# Patient Record
Sex: Male | Born: 1990 | Race: White | Hispanic: No | Marital: Single | Smoking: Current some day smoker
Health system: Southern US, Community
[De-identification: ages and names within clinical notes are randomized; demographics above are authoritative.]

## PROBLEM LIST (undated history)

## (undated) DIAGNOSIS — F609 Personality disorder, unspecified: Secondary | ICD-10-CM

## (undated) DIAGNOSIS — J45909 Unspecified asthma, uncomplicated: Secondary | ICD-10-CM

## (undated) DIAGNOSIS — I10 Essential (primary) hypertension: Secondary | ICD-10-CM

## (undated) DIAGNOSIS — F4310 Post-traumatic stress disorder, unspecified: Secondary | ICD-10-CM

## (undated) DIAGNOSIS — K259 Gastric ulcer, unspecified as acute or chronic, without hemorrhage or perforation: Secondary | ICD-10-CM

## (undated) DIAGNOSIS — F909 Attention-deficit hyperactivity disorder, unspecified type: Secondary | ICD-10-CM

## (undated) HISTORY — PX: FOREARM SURGERY: SHX651

---
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
## 2012-11-22 ENCOUNTER — Encounter (HOSPITAL_COMMUNITY): Payer: Self-pay | Admitting: *Deleted

## 2012-11-22 ENCOUNTER — Emergency Department (HOSPITAL_COMMUNITY)
Admission: EM | Admit: 2012-11-22 | Discharge: 2012-11-22 | Disposition: A | Discharge status: Home / Self Care | Payer: Medicaid Other | Attending: Emergency Medicine | Admitting: Emergency Medicine

## 2012-11-22 DIAGNOSIS — Z8669 Personal history of other diseases of the nervous system and sense organs: Secondary | ICD-10-CM | POA: Insufficient documentation

## 2012-11-22 DIAGNOSIS — Z8719 Personal history of other diseases of the digestive system: Secondary | ICD-10-CM | POA: Insufficient documentation

## 2012-11-22 DIAGNOSIS — E119 Type 2 diabetes mellitus without complications: Secondary | ICD-10-CM | POA: Insufficient documentation

## 2012-11-22 DIAGNOSIS — R109 Unspecified abdominal pain: Secondary | ICD-10-CM | POA: Insufficient documentation

## 2012-11-22 DIAGNOSIS — R197 Diarrhea, unspecified: Secondary | ICD-10-CM | POA: Insufficient documentation

## 2012-11-22 DIAGNOSIS — R112 Nausea with vomiting, unspecified: Secondary | ICD-10-CM | POA: Insufficient documentation

## 2012-11-22 DIAGNOSIS — F17200 Nicotine dependence, unspecified, uncomplicated: Secondary | ICD-10-CM | POA: Insufficient documentation

## 2012-11-22 DIAGNOSIS — R52 Pain, unspecified: Secondary | ICD-10-CM | POA: Insufficient documentation

## 2012-11-22 HISTORY — DX: Post-traumatic stress disorder, unspecified: F43.10

## 2012-11-22 HISTORY — DX: Gastric ulcer, unspecified as acute or chronic, without hemorrhage or perforation: K25.9

## 2012-11-22 MED ORDER — KETOROLAC TROMETHAMINE 30 MG/ML IJ SOLN
30.0000 mg | Freq: Once | INTRAMUSCULAR | Status: AC
  Administered 2012-11-22: 30 mg via INTRAVENOUS
  Filled 2012-11-22: qty 1

## 2012-11-22 MED ORDER — PROMETHAZINE HCL 25 MG PO TABS: 25.0000 mg | ORAL_TABLET | Freq: Four times a day (QID) | ORAL | Status: DC | PRN

## 2012-11-22 MED ORDER — SODIUM CHLORIDE 0.9 % IV BOLUS (SEPSIS)
1000.0000 mL | Freq: Once | INTRAVENOUS | Status: AC
  Administered 2012-11-22: 1000 mL via INTRAVENOUS

## 2012-11-22 MED ORDER — ONDANSETRON HCL 4 MG/2ML IJ SOLN
4.0000 mg | Freq: Once | INTRAMUSCULAR | Status: AC
  Administered 2012-11-22: 4 mg via INTRAVENOUS
  Filled 2012-11-22: qty 2

## 2012-11-22 NOTE — ED Provider Notes (Signed)
History     CSN: [id number]  Arrival date & time 11/22/12  0028   First MD Initiated Contact with Patient 11/22/12 0047      Chief Complaint  Patient presents with  . Abdominal Pain  . Nausea  . Emesis  . Diarrhea  . Generalized Body Aches    (Consider location/radiation/quality/duration/timing/severity/associated sxs/prior treatment) The history is provided by the patient.   nausea vomiting and diarrhea for the last 3 days. No fevers. No blood in emesis or stools. Has had some intermittent abdominal cramping. No pain at this time. No known sick contacts. No rash or shortness of breath. No recent travel. No recent antibiotics. Brought in by EMS and was given Zofran prior to arrival. Still some mild nausea after medication. Moderate in severity.  Past Medical History  Diagnosis Date  . Diabetes mellitus without complication   . PTSD (post-traumatic stress disorder)   . Stomach ulcer     History reviewed. No pertinent past surgical history.  History reviewed. No pertinent family history.  History  Substance Use Topics  . Smoking status: Current Some Day Smoker  . Smokeless tobacco: Never Used  . Alcohol Use: No      Review of Systems  Constitutional: Negative for fever and chills.  HENT: Negative for neck pain and neck stiffness.   Eyes: Negative for pain.  Respiratory: Negative for shortness of breath.   Cardiovascular: Negative for chest pain.  Gastrointestinal: Positive for nausea, vomiting and diarrhea. Negative for abdominal pain and blood in stool.  Genitourinary: Negative for dysuria.  Musculoskeletal: Negative for back pain.  Skin: Negative for rash.  Neurological: Negative for headaches.  All other systems reviewed and are negative.    Allergies  Geodon  Home Medications   Current Outpatient Rx  Name  Route  Sig  Dispense  Refill  . ONDANSETRON HCL 4 MG PO TABS   Oral   Take 4 mg by mouth once.           BP 107/55  Pulse 62  Temp 98.9  F (37.2 C) (Oral)  Resp 21  SpO2 98%  Physical Exam  Constitutional: He is oriented to person, place, and time. He appears well-developed and well-nourished.  HENT:  Head: Normocephalic and atraumatic.  Mouth/Throat: Oropharynx is clear and moist. No oropharyngeal exudate.       Dry mucous membranes  Eyes: EOM are normal. Pupils are equal, round, and reactive to light. No scleral icterus.  Neck: Normal range of motion. Neck supple.  Cardiovascular: Normal rate, regular rhythm and intact distal pulses.   Pulmonary/Chest: Effort normal and breath sounds normal. No respiratory distress. He has no rales. He exhibits no tenderness.  Abdominal: Soft. Bowel sounds are normal. He exhibits no distension. There is no tenderness. There is no rebound and no guarding.  Musculoskeletal: Normal range of motion. He exhibits no edema.  Lymphadenopathy:    He has no cervical adenopathy.  Neurological: He is alert and oriented to person, place, and time.  Skin: Skin is warm and dry.    ED Course  Procedures (including critical care time)  IV fluids. Zofran. Toradol  2:58 AM on recheck is resting comfortably with benign abdominal exam. No emesis or diarrhea in the emergency department. Patient is comfortable to be discharged home. Plan prescription of Phenergan with good return precautions verbalized is understood. MDM   Nausea vomiting and diarrhea likely viral illness. No acute abdomen serial exams.  Provided IV fluids and medications as above. All  signs and nursing notes reviewed and considered.  Condition improved and stable for discharge home        Sunnie Nielsen, MD 11/22/12 0301

## 2012-11-22 NOTE — ED Notes (Signed)
Per EMS: pt coming from a group home. Pt c/o abdominal pain, n/v/d, body aches and cramps. 20g IV in right AC. Pt given 4mg  of zofran at 0015. A&Ox4, skin warm and dry, respirations equal and unlabored. Pt was dry heaving en route.

## 2012-12-24 ENCOUNTER — Encounter (HOSPITAL_COMMUNITY): Payer: Self-pay | Admitting: *Deleted

## 2012-12-24 ENCOUNTER — Emergency Department (HOSPITAL_COMMUNITY)
Admission: EM | Admit: 2012-12-24 | Discharge: 2012-12-24 | Disposition: A | Discharge status: Home / Self Care | Payer: Medicaid Other | Attending: Emergency Medicine | Admitting: Emergency Medicine

## 2012-12-24 DIAGNOSIS — X58XXXA Exposure to other specified factors, initial encounter: Secondary | ICD-10-CM | POA: Insufficient documentation

## 2012-12-24 DIAGNOSIS — Y929 Unspecified place or not applicable: Secondary | ICD-10-CM | POA: Insufficient documentation

## 2012-12-24 DIAGNOSIS — E119 Type 2 diabetes mellitus without complications: Secondary | ICD-10-CM | POA: Insufficient documentation

## 2012-12-24 DIAGNOSIS — L089 Local infection of the skin and subcutaneous tissue, unspecified: Secondary | ICD-10-CM

## 2012-12-24 DIAGNOSIS — S91009A Unspecified open wound, unspecified ankle, initial encounter: Secondary | ICD-10-CM | POA: Insufficient documentation

## 2012-12-24 DIAGNOSIS — S81009A Unspecified open wound, unspecified knee, initial encounter: Secondary | ICD-10-CM | POA: Insufficient documentation

## 2012-12-24 DIAGNOSIS — R509 Fever, unspecified: Secondary | ICD-10-CM | POA: Insufficient documentation

## 2012-12-24 DIAGNOSIS — Z79899 Other long term (current) drug therapy: Secondary | ICD-10-CM | POA: Insufficient documentation

## 2012-12-24 DIAGNOSIS — Z8659 Personal history of other mental and behavioral disorders: Secondary | ICD-10-CM | POA: Insufficient documentation

## 2012-12-24 DIAGNOSIS — Y939 Activity, unspecified: Secondary | ICD-10-CM | POA: Insufficient documentation

## 2012-12-24 DIAGNOSIS — F17200 Nicotine dependence, unspecified, uncomplicated: Secondary | ICD-10-CM | POA: Insufficient documentation

## 2012-12-24 DIAGNOSIS — Z8719 Personal history of other diseases of the digestive system: Secondary | ICD-10-CM | POA: Insufficient documentation

## 2012-12-24 MED ORDER — CEFTRIAXONE SODIUM 1 G IJ SOLR
1.0000 g | Freq: Once | INTRAMUSCULAR | Status: AC
  Administered 2012-12-24: 1 g via INTRAMUSCULAR
  Filled 2012-12-24: qty 10

## 2012-12-24 MED ORDER — SULFAMETHOXAZOLE-TMP DS 800-160 MG PO TABS
1.0000 | ORAL_TABLET | Freq: Once | ORAL | Status: AC
  Administered 2012-12-24: 1 via ORAL
  Filled 2012-12-24: qty 1

## 2012-12-24 MED ORDER — SULFAMETHOXAZOLE-TRIMETHOPRIM 800-160 MG PO TABS: 1.0000 | ORAL_TABLET | Freq: Two times a day (BID) | ORAL | Status: DC

## 2012-12-24 NOTE — ED Notes (Signed)
Patient reports he has a lac to his left knee for two to three days.  He states the area is looking infected,  States he is unsure how this happened

## 2012-12-24 NOTE — ED Provider Notes (Signed)
History     CSN: [id number]  Arrival date & time 12/24/12  1218   First MD Initiated Contact with Patient 12/24/12 1233      Chief Complaint  Patient presents with  . Extremity Laceration    (Consider location/radiation/quality/duration/timing/severity/associated sxs/prior treatment) HPI  Christopher Meadows is a 22 y.o. male complaining of laceration to left knee which she noticed 2 days ago while he was in jail. Patient states that he has no recollection of how the injury occurred. He states his pain is minimal. He reports subjective fever with no nausea or vomiting. He's been cleansing the wound with rubbing alcohol, but there is active drainage.  Past Medical History  Diagnosis Date  . Diabetes mellitus without complication   . PTSD (post-traumatic stress disorder)   . Stomach ulcer     Past Surgical History  Procedure Date  . Forearm surgery     No family history on file.  History  Substance Use Topics  . Smoking status: Current Some Day Smoker  . Smokeless tobacco: Never Used  . Alcohol Use: No      Review of Systems  Constitutional: Positive for fever.  Respiratory: Negative for shortness of breath.   Cardiovascular: Negative for chest pain.  Gastrointestinal: Negative for nausea, vomiting, abdominal pain and diarrhea.  Skin: Positive for wound.  All other systems reviewed and are negative.    Allergies  Geodon  Home Medications   Current Outpatient Rx  Name  Route  Sig  Dispense  Refill  . BUSPIRONE HCL 10 MG PO TABS   Oral   Take 10 mg by mouth 3 (three) times daily.         Marland Kitchen CLONAZEPAM 0.5 MG PO TABS   Oral   Take 0.5 mg by mouth daily.         Marland Kitchen CLOZAPINE 200 MG PO TABS   Oral   Take 200 mg by mouth daily.         Marland Kitchen OXCARBAZEPINE 600 MG PO TABS   Oral   Take 600 mg by mouth 2 (two) times daily.         . SERTRALINE HCL 50 MG PO TABS   Oral   Take 50 mg by mouth daily.           BP 126/61  Pulse 83  Temp 98.5 F (36.9  C) (Oral)  Resp 20  Ht 5\' 10"  (1.778 m)  Wt 268 lb (121.564 kg)  BMI 38.45 kg/m2  SpO2 98%  Physical Exam  Nursing note and vitals reviewed. Constitutional: He is oriented to person, place, and time. He appears well-developed and well-nourished. No distress.  HENT:  Head: Normocephalic.  Eyes: Conjunctivae normal and EOM are normal.  Cardiovascular: Normal rate.   Pulmonary/Chest: Effort normal. No stridor.  Musculoskeletal: Normal range of motion.  Neurological: He is alert and oriented to person, place, and time.  Skin:          4 cm full-thickness laceration with no intrusion into the joint space or bony involvement. Actively draining purulent material. 1 cm of surrounding induration.  Psychiatric: He has a normal mood and affect.    ED Course  Procedures (including critical care time)  Wound irrigated under low pressure with 1.5 L normal saline explored to depth and good light on a bloodless field  Labs Reviewed - No data to display No results found.   1. Infected wound       MDM  Wound profusely irrigated,  patient given a gram of Rocephin IM and started on Bactrim. I instructed him to return in 48 hours for a wound check. Explicit return precautions given.   Filed Vitals:   12/24/12 1221 12/24/12 1226  BP: 126/61   Pulse: 83   Temp: 98.5 F (36.9 C)   TempSrc: Oral   Resp: 20   Height:  5\' 10"  (1.778 m)  Weight:  268 lb (121.564 kg)  SpO2: 98%      Pt verbalized understanding and agrees with care plan. Outpatient follow-up and return precautions given.    New Prescriptions   SULFAMETHOXAZOLE-TRIMETHOPRIM (SEPTRA DS) 800-160 MG PER TABLET    Take 1 tablet by mouth every 12 (twelve) hours.            Wynetta Emery, PA-C 12/24/12 2203

## 2012-12-25 NOTE — ED Provider Notes (Signed)
Medical screening examination/treatment/procedure(s) were performed by non-physician practitioner and as supervising physician I was immediately available for consultation/collaboration.   Eulas Schweitzer E Marcelo Ickes, MD 12/25/12 1449 

## 2013-01-17 ENCOUNTER — Encounter (HOSPITAL_COMMUNITY): Payer: Self-pay | Admitting: Emergency Medicine

## 2013-01-17 ENCOUNTER — Emergency Department (INDEPENDENT_AMBULATORY_CARE_PROVIDER_SITE_OTHER)
Admission: EM | Admit: 2013-01-17 | Discharge: 2013-01-17 | Disposition: A | Discharge status: Home / Self Care | Payer: Medicaid Other | Source: Home / Self Care | Attending: Family Medicine | Admitting: Family Medicine

## 2013-01-17 DIAGNOSIS — I10 Essential (primary) hypertension: Secondary | ICD-10-CM

## 2013-01-17 HISTORY — DX: Personality disorder, unspecified: F60.9

## 2013-01-17 MED ORDER — CARVEDILOL 6.25 MG PO TABS: 6.2500 mg | ORAL_TABLET | Freq: Two times a day (BID) | ORAL | Status: DC

## 2013-01-17 MED ORDER — METFORMIN HCL 500 MG PO TABS: 500.0000 mg | ORAL_TABLET | Freq: Two times a day (BID) | ORAL | Status: DC

## 2013-01-17 NOTE — ED Provider Notes (Signed)
History     CSN: [id number]  Arrival date & time 01/17/13  1152   First MD Initiated Contact with Patient 01/17/13 1220      Chief Complaint  Patient presents with  . Medication Refill    (Consider location/radiation/quality/duration/timing/severity/associated sxs/prior treatment) Patient is a 22 y.o. male presenting with hypertension. The history is provided by the patient and a caregiver.  Hypertension This is a chronic problem. Episode onset: here for medication refills prior to reestablishing with lmd.    Past Medical History  Diagnosis Date  . Diabetes mellitus without complication   . PTSD (post-traumatic stress disorder)   . Stomach ulcer   . Personality disorder     Past Surgical History  Procedure Laterality Date  . Forearm surgery      No family history on file.  History  Substance Use Topics  . Smoking status: Current Some Day Smoker  . Smokeless tobacco: Never Used  . Alcohol Use: No      Review of Systems  Constitutional: Negative.     Allergies  Geodon  Home Medications   Current Outpatient Rx  Name  Route  Sig  Dispense  Refill  . busPIRone (BUSPAR) 10 MG tablet   Oral   Take 10 mg by mouth 3 (three) times daily.         . clonazePAM (KLONOPIN) 0.5 MG tablet   Oral   Take 0.5 mg by mouth daily.         . clozapine (CLOZARIL) 200 MG tablet   Oral   Take 200 mg by mouth daily.         . hydrOXYzine (ATARAX/VISTARIL) 25 MG tablet   Oral   Take 25 mg by mouth 3 (three) times daily as needed for itching.         Marland Kitchen oxcarbazepine (TRILEPTAL) 600 MG tablet   Oral   Take 600 mg by mouth 2 (two) times daily.         . sertraline (ZOLOFT) 50 MG tablet   Oral   Take 50 mg by mouth daily.         . carvedilol (COREG) 6.25 MG tablet   Oral   Take 1 tablet (6.25 mg total) by mouth 2 (two) times daily with a meal.   60 tablet   1   . metFORMIN (GLUCOPHAGE) 500 MG tablet   Oral   Take 1 tablet (500 mg total) by mouth 2  (two) times daily with a meal.   60 tablet   1   . sulfamethoxazole-trimethoprim (SEPTRA DS) 800-160 MG per tablet   Oral   Take 1 tablet by mouth every 12 (twelve) hours.   14 tablet   0     BP 128/72  Pulse 63  Temp(Src) 97.9 F (36.6 C) (Oral)  Resp 20  SpO2 98%  Physical Exam  Nursing note and vitals reviewed. Constitutional: He is oriented to person, place, and time. He appears well-developed and well-nourished.  Neurological: He is alert and oriented to person, place, and time.  Skin: Skin is warm and dry.    ED Course  Procedures (including critical care time)  Labs Reviewed - No data to display No results found.   1. Hypertension       MDM          Linna Hoff, MD 01/17/13 [phone]

## 2013-01-17 NOTE — ED Notes (Addendum)
Patient here today for refill of certain medicines.  Patient is in a group home locally, arrived December 2013.  Patient reports trouble sleeping, sob during the night.   Also requesting refills of the following:   aspirin ec 81mg  qd Carvedilol 3.125mg  bid,  Metformin 500mg  bid,  Carvedilol 6.25mg  bid

## 2013-01-24 ENCOUNTER — Emergency Department: Payer: Self-pay | Admitting: Emergency Medicine

## 2013-01-24 LAB — CBC
HCT: 45.2 % (ref 40.0–52.0)
HGB: 15.1 g/dL (ref 13.0–18.0)
MCHC: 33.4 g/dL (ref 32.0–36.0)
MCV: 84 fL (ref 80–100)
Platelet: 264 10*3/uL (ref 150–440)
RBC: 5.38 10*6/uL (ref 4.40–5.90)
RDW: 13.7 % (ref 11.5–14.5)
WBC: 10.2 10*3/uL (ref 3.8–10.6)

## 2013-01-24 LAB — COMPREHENSIVE METABOLIC PANEL
Albumin: 4.2 g/dL (ref 3.8–5.6)
BUN: 15 mg/dL (ref 7–18)
Bilirubin,Total: 0.2 mg/dL (ref 0.2–1.0)
Calcium, Total: 8.7 mg/dL — ABNORMAL LOW (ref 9.0–10.7)
Chloride: 108 mmol/L — ABNORMAL HIGH (ref 98–107)
Co2: 25 mmol/L (ref 21–32)
Creatinine: 0.87 mg/dL (ref 0.60–1.30)
EGFR (African American): >60
Osmolality: 278 (ref 275–301)
Potassium: 4.1 mmol/L (ref 3.5–5.1)

## 2013-01-31 ENCOUNTER — Emergency Department: Payer: Self-pay | Admitting: Emergency Medicine

## 2013-01-31 LAB — URINALYSIS, COMPLETE
Bacteria: NONE SEEN
Glucose,UR: NEGATIVE mg/dL (ref 0–75)
Ph: 6 (ref 4.5–8.0)
RBC,UR: <1 /HPF (ref 0–5)
Specific Gravity: 1.024 (ref 1.003–1.030)

## 2013-01-31 LAB — COMPREHENSIVE METABOLIC PANEL
Alkaline Phosphatase: 155 U/L (ref 98–317)
Anion Gap: 7 (ref 7–16)
BUN: 11 mg/dL (ref 7–18)
Calcium, Total: 9 mg/dL (ref 9.0–10.7)
Co2: 26 mmol/L (ref 21–32)
Creatinine: 0.7 mg/dL (ref 0.60–1.30)
EGFR (African American): >60
Glucose: 111 mg/dL — ABNORMAL HIGH (ref 65–99)
SGOT(AST): 24 U/L (ref 10–41)
SGPT (ALT): 54 U/L (ref 12–78)
Sodium: 140 mmol/L (ref 136–145)

## 2013-01-31 LAB — DRUG SCREEN, URINE
Amphetamines, Ur Screen: NEGATIVE (ref ?–1000)
Barbiturates, Ur Screen: NEGATIVE (ref ?–200)
Cocaine Metabolite,Ur ~~LOC~~: NEGATIVE (ref ?–300)
MDMA (Ecstasy)Ur Screen: NEGATIVE (ref ?–500)
Phencyclidine (PCP) Ur S: NEGATIVE (ref ?–25)

## 2013-01-31 LAB — CBC
HGB: 16.1 g/dL (ref 13.0–18.0)
MCH: 29.4 pg (ref 26.0–34.0)
RDW: 13.3 % (ref 11.5–14.5)
WBC: 9.5 10*3/uL (ref 3.8–10.6)

## 2013-01-31 LAB — ETHANOL
Ethanol %: <0.003 % (ref 0.000–0.080)
Ethanol: <3 mg/dL

## 2013-01-31 LAB — TSH: Thyroid Stimulating Horm: 1.01 u[IU]/mL

## 2013-02-03 ENCOUNTER — Encounter (HOSPITAL_COMMUNITY): Payer: Self-pay | Admitting: *Deleted

## 2013-02-03 ENCOUNTER — Emergency Department (HOSPITAL_COMMUNITY)
Admission: EM | Admit: 2013-02-03 | Discharge: 2013-02-04 | Disposition: A | Discharge status: Home / Self Care | Payer: Medicaid Other | Attending: Emergency Medicine | Admitting: Emergency Medicine

## 2013-02-03 DIAGNOSIS — R51 Headache: Secondary | ICD-10-CM | POA: Insufficient documentation

## 2013-02-03 DIAGNOSIS — E119 Type 2 diabetes mellitus without complications: Secondary | ICD-10-CM | POA: Insufficient documentation

## 2013-02-03 DIAGNOSIS — F17200 Nicotine dependence, unspecified, uncomplicated: Secondary | ICD-10-CM | POA: Insufficient documentation

## 2013-02-03 DIAGNOSIS — F609 Personality disorder, unspecified: Secondary | ICD-10-CM | POA: Insufficient documentation

## 2013-02-03 DIAGNOSIS — Z8659 Personal history of other mental and behavioral disorders: Secondary | ICD-10-CM | POA: Insufficient documentation

## 2013-02-03 DIAGNOSIS — Z8719 Personal history of other diseases of the digestive system: Secondary | ICD-10-CM | POA: Insufficient documentation

## 2013-02-03 DIAGNOSIS — Z79899 Other long term (current) drug therapy: Secondary | ICD-10-CM | POA: Insufficient documentation

## 2013-02-03 MED ORDER — METOCLOPRAMIDE HCL 5 MG/ML IJ SOLN
10.0000 mg | Freq: Once | INTRAMUSCULAR | Status: AC
  Administered 2013-02-03: 10 mg via INTRAMUSCULAR
  Filled 2013-02-03: qty 2

## 2013-02-03 MED ORDER — DIPHENHYDRAMINE HCL 50 MG/ML IJ SOLN
25.0000 mg | Freq: Once | INTRAMUSCULAR | Status: AC
  Administered 2013-02-03: 25 mg via INTRAMUSCULAR
  Filled 2013-02-03: qty 1

## 2013-02-03 MED ORDER — KETOROLAC TROMETHAMINE 60 MG/2ML IM SOLN
60.0000 mg | Freq: Once | INTRAMUSCULAR | Status: AC
  Administered 2013-02-03: 60 mg via INTRAMUSCULAR
  Filled 2013-02-03: qty 2

## 2013-02-03 NOTE — ED Notes (Signed)
Pt was brought in via [facility] for headache,  He lives in group home when he is discharged Christopher Meadows at [phone] is to be called for pick up.  Pt walked in from EMS and immediately went out of door to smoke,  Pt now back in for triage,  Pt states ha 10/10

## 2013-02-03 NOTE — ED Provider Notes (Signed)
  Medical screening examination/treatment/procedure(s) were performed by non-physician practitioner and as supervising physician I was immediately available for consultation/collaboration.    Percival Glasheen D Kimberl Vig, MD 02/03/13 2353 

## 2013-02-03 NOTE — ED Notes (Signed)
Pt called back to room, no response. 

## 2013-02-03 NOTE — ED Notes (Signed)
Attempted to call Brett Canales, no answer will return call in 5 minutes

## 2013-02-03 NOTE — ED Notes (Signed)
Spoke with pharmacy stated that Toradol and Benadryl cannot be given together, otherwise they can be mixed.

## 2013-02-03 NOTE — ED Notes (Signed)
Called Brett Canales again, left message for Brett Canales to return call.  Pt will be discharged at this time

## 2013-02-03 NOTE — ED Provider Notes (Signed)
History    This chart was scribed for non-physician practitioner working with Vida Roller, MD by Toya Smothers, ED Scribe. This patient was seen in room WTR5/WTR5 and the patient's care was started at 23:00. CSN: [id number]  Arrival date & time 02/03/13  2059   First MD Initiated Contact with Patient 02/03/13 2226      Chief Complaint  Patient presents with  . Headache    Patient is a 22 y.o. male presenting with headaches. The history is provided by the patient. No language interpreter was used.  Headache Associated symptoms: no abdominal pain, no back pain, no cough, no diarrhea, no fatigue, no fever, no nausea, no neck stiffness and no vomiting     Kaydin Karbowski is a 22 y.o. male who presents to the Emergency Department complaining of HA. Pt is a poor historian and is somewhat uncooperative with hx taking. Pain is 8/10 and unlike any before; but he does not remember if he has had headaches in the past. Symptoms have not been treated PTA. No fever, chills, cough, congestion, rhinorrhea, chest pain, SOB, or n/v/d. Pt denies illicit drug use and alcohol consumption. Nothing makes it better and nothing makes it worse.  Pt sleeping on initiation of exam and remains sleepy throughout.  Pt has been out smoking several times after being brought here.     Past Medical History  Diagnosis Date  . Diabetes mellitus without complication   . PTSD (post-traumatic stress disorder)   . Stomach ulcer   . Personality disorder     Past Surgical History  Procedure Laterality Date  . Forearm surgery      History reviewed. No pertinent family history.  History  Substance Use Topics  . Smoking status: Current Some Day Smoker  . Smokeless tobacco: Never Used  . Alcohol Use: No      Review of Systems  Constitutional: Negative for fever, diaphoresis, appetite change, fatigue and unexpected weight change.  HENT: Negative for mouth sores and neck stiffness.   Eyes: Negative for visual  disturbance.  Respiratory: Negative for cough, chest tightness, shortness of breath and wheezing.   Cardiovascular: Negative for chest pain.  Gastrointestinal: Negative for nausea, vomiting, abdominal pain, diarrhea and constipation.  Endocrine: Negative for polydipsia, polyphagia and polyuria.  Genitourinary: Negative for dysuria, urgency, frequency and hematuria.  Musculoskeletal: Negative for back pain.  Skin: Negative for rash.  Allergic/Immunologic: Negative for immunocompromised state.  Neurological: Positive for headaches. Negative for syncope and light-headedness.  Hematological: Does not bruise/bleed easily.  Psychiatric/Behavioral: Negative for sleep disturbance. The patient is not nervous/anxious.     Allergies  Geodon and Lithium  Home Medications   Current Outpatient Rx  Name  Route  Sig  Dispense  Refill  . busPIRone (BUSPAR) 10 MG tablet   Oral   Take 10 mg by mouth 3 (three) times daily.         . carvedilol (COREG) 6.25 MG tablet   Oral   Take 1 tablet (6.25 mg total) by mouth 2 (two) times daily with a meal.   60 tablet   1   . clonazePAM (KLONOPIN) 0.5 MG tablet   Oral   Take 0.5 mg by mouth daily.         . clozapine (CLOZARIL) 200 MG tablet   Oral   Take 200 mg by mouth daily.         . hydrOXYzine (ATARAX/VISTARIL) 25 MG tablet   Oral   Take 25 mg by mouth  3 (three) times daily as needed for itching.         . metFORMIN (GLUCOPHAGE) 500 MG tablet   Oral   Take 1 tablet (500 mg total) by mouth 2 (two) times daily with a meal.   60 tablet   1   . oxcarbazepine (TRILEPTAL) 600 MG tablet   Oral   Take 600 mg by mouth 2 (two) times daily.         . sertraline (ZOLOFT) 50 MG tablet   Oral   Take 50 mg by mouth daily.           BP 117/72  Pulse 67  Temp(Src) 98.7 F (37.1 C) (Oral)  Resp 16  SpO2 97%  Physical Exam  Nursing note and vitals reviewed. Constitutional: He is oriented to person, place, and time. He appears  well-developed and well-nourished. No distress.  HENT:  Head: Normocephalic and atraumatic. Not macrocephalic.  Right Ear: Tympanic membrane, external ear and ear canal normal.  Left Ear: Tympanic membrane, external ear and ear canal normal.  Nose: Nose normal. No mucosal edema or rhinorrhea.  Mouth/Throat: Uvula is midline, oropharynx is clear and moist and mucous membranes are normal. Mucous membranes are not dry and not cyanotic. No edematous. No oropharyngeal exudate, posterior oropharyngeal edema, posterior oropharyngeal erythema or tonsillar abscesses.  Eyes: Conjunctivae are normal. Pupils are equal, round, and reactive to light. No scleral icterus.  Pupils dilated but reactive  Neck: Normal range of motion. Neck supple. No thyromegaly present.  Cardiovascular: Normal rate, regular rhythm, normal heart sounds and intact distal pulses.  Exam reveals no gallop and no friction rub.   No murmur heard. Pulmonary/Chest: Effort normal and breath sounds normal. No respiratory distress. He has no wheezes. He has no rales. He exhibits no tenderness.  Abdominal: Soft. Bowel sounds are normal. He exhibits no distension and no mass. There is no tenderness. There is no rebound and no guarding.  Musculoskeletal: Normal range of motion. He exhibits no edema.  Lymphadenopathy:    He has no cervical adenopathy.  Neurological: He is alert and oriented to person, place, and time. He has normal reflexes. No cranial nerve deficit. He exhibits normal muscle tone. Coordination normal. GCS eye subscore is 4. GCS verbal subscore is 5. GCS motor subscore is 6.  Reflex Scores:      Tricep reflexes are 2+ on the right side and 2+ on the left side.      Bicep reflexes are 2+ on the right side and 2+ on the left side.      Brachioradialis reflexes are 2+ on the right side and 2+ on the left side.      Patellar reflexes are 2+ on the right side and 2+ on the left side.      Achilles reflexes are 2+ on the right side  and 2+ on the left side. Speech is clear and goal oriented, follows commands Major Cranial nerves without deficit, no facial droop Normal strength in upper and lower extremities bilaterally including dorsiflexion and plantar flexion, strong and equal grip strength Sensation normal to light and sharp touch Moves extremities without ataxia, coordination intact Normal finger to nose and rapid alternating movements Neg romberg, no pronator drift Normal gait and balance  Skin: Skin is warm and dry. He is not diaphoretic. No erythema.  Psychiatric: He has a normal mood and affect.    ED Course  Procedures DIAGNOSTIC STUDIES: Oxygen Saturation is 97% on room air, normal by my interpretation.  COORDINATION OF CARE: 23:00- Evaluated Pt. Pt is lathargic and without distress. 23:04- Patient understand and agree with initial ED impression and plan with expectations set for ED visit.    Labs Reviewed - No data to display No results found.   1. Headache       MDM  Karrie Meres presents with headache.  Pt HA treated and improved while in ED.  Presentation is like pts typical HA and non concerning for [facility], [facility], Meningitis, or temporal arteritis. Pt is afebrile with no focal neuro deficits, nuchal rigidity, or change in vision. Pt is to follow up with PCP to discuss prophylactic medication. Pt verbalizes understanding and is agreeable with plan to dc.     I personally performed the services described in this documentation, which was scribed in my presence. The recorded information has been reviewed and is accurate.   Dahlia Client Muthersbaugh, PA-C 02/03/13 [phone]

## 2013-02-27 ENCOUNTER — Emergency Department (INDEPENDENT_AMBULATORY_CARE_PROVIDER_SITE_OTHER)
Admission: EM | Admit: 2013-02-27 | Discharge: 2013-02-27 | Disposition: A | Discharge status: Home / Self Care | Payer: Medicaid Other | Source: Home / Self Care | Attending: Emergency Medicine | Admitting: Emergency Medicine

## 2013-02-27 ENCOUNTER — Encounter (HOSPITAL_COMMUNITY): Payer: Self-pay | Admitting: Emergency Medicine

## 2013-02-27 DIAGNOSIS — T300 Burn of unspecified body region, unspecified degree: Secondary | ICD-10-CM

## 2013-02-27 DIAGNOSIS — R062 Wheezing: Secondary | ICD-10-CM

## 2013-02-27 MED ORDER — "TELFA CLEAR WOUND 3""X3"" PADS": MEDICATED_PAD | Status: DC

## 2013-02-27 MED ORDER — SILVER SULFADIAZINE 1 % EX CREA: TOPICAL_CREAM | Freq: Every day | CUTANEOUS | Status: DC

## 2013-02-27 MED ORDER — ALBUTEROL SULFATE HFA 108 (90 BASE) MCG/ACT IN AERS: 1.0000 | INHALATION_SPRAY | Freq: Four times a day (QID) | RESPIRATORY_TRACT | Status: AC | PRN

## 2013-02-27 MED ORDER — SILVER SULFADIAZINE 1 % EX CREA
TOPICAL_CREAM | Freq: Once | CUTANEOUS | Status: AC
  Administered 2013-02-27: 13:00:00 via TOPICAL
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
## 2013-03-09 ENCOUNTER — Encounter (HOSPITAL_COMMUNITY): Payer: Self-pay | Admitting: *Deleted

## 2013-03-09 ENCOUNTER — Emergency Department (HOSPITAL_COMMUNITY)
Admission: EM | Admit: 2013-03-09 | Discharge: 2013-03-10 | Disposition: A | Discharge status: Other Acute Inpatient Hospital | Payer: Medicaid Other | Attending: Emergency Medicine | Admitting: Emergency Medicine

## 2013-03-09 DIAGNOSIS — Z8711 Personal history of peptic ulcer disease: Secondary | ICD-10-CM | POA: Insufficient documentation

## 2013-03-09 DIAGNOSIS — F609 Personality disorder, unspecified: Secondary | ICD-10-CM | POA: Insufficient documentation

## 2013-03-09 DIAGNOSIS — Z789 Other specified health status: Secondary | ICD-10-CM | POA: Insufficient documentation

## 2013-03-09 DIAGNOSIS — E119 Type 2 diabetes mellitus without complications: Secondary | ICD-10-CM | POA: Insufficient documentation

## 2013-03-09 DIAGNOSIS — R45851 Suicidal ideations: Secondary | ICD-10-CM

## 2013-03-09 DIAGNOSIS — T300 Burn of unspecified body region, unspecified degree: Secondary | ICD-10-CM

## 2013-03-09 DIAGNOSIS — I10 Essential (primary) hypertension: Secondary | ICD-10-CM | POA: Insufficient documentation

## 2013-03-09 DIAGNOSIS — Z79899 Other long term (current) drug therapy: Secondary | ICD-10-CM | POA: Insufficient documentation

## 2013-03-09 DIAGNOSIS — R21 Rash and other nonspecific skin eruption: Secondary | ICD-10-CM | POA: Insufficient documentation

## 2013-03-09 DIAGNOSIS — T22059A Burn of unspecified degree of unspecified shoulder, initial encounter: Secondary | ICD-10-CM | POA: Insufficient documentation

## 2013-03-09 DIAGNOSIS — X76XXXA Intentional self-harm by smoke, fire and flames, initial encounter: Secondary | ICD-10-CM | POA: Insufficient documentation

## 2013-03-09 DIAGNOSIS — F17200 Nicotine dependence, unspecified, uncomplicated: Secondary | ICD-10-CM | POA: Insufficient documentation

## 2013-03-09 DIAGNOSIS — Z593 Problems related to living in residential institution: Secondary | ICD-10-CM | POA: Insufficient documentation

## 2013-03-09 DIAGNOSIS — F4310 Post-traumatic stress disorder, unspecified: Secondary | ICD-10-CM | POA: Insufficient documentation

## 2013-03-09 HISTORY — DX: Unspecified asthma, uncomplicated: J45.909

## 2013-03-09 HISTORY — DX: Essential (primary) hypertension: I10

## 2013-03-09 HISTORY — DX: Attention-deficit hyperactivity disorder, unspecified type: F90.9

## 2013-03-09 LAB — COMPREHENSIVE METABOLIC PANEL
AST: 66 U/L — ABNORMAL HIGH (ref 0–37)
Albumin: 3.7 g/dL (ref 3.5–5.2)
Alkaline Phosphatase: 129 U/L — ABNORMAL HIGH (ref 39–117)
Chloride: 107 mEq/L (ref 96–112)
Potassium: 3.6 mEq/L (ref 3.5–5.1)
Sodium: 142 mEq/L (ref 135–145)
Total Bilirubin: 0.2 mg/dL — ABNORMAL LOW (ref 0.3–1.2)
Total Protein: 6 g/dL (ref 6.0–8.3)

## 2013-03-09 LAB — CBC WITH DIFFERENTIAL/PLATELET
Basophils Absolute: 0 10*3/uL (ref 0.0–0.1)
Eosinophils Absolute: 0.8 10*3/uL — ABNORMAL HIGH (ref 0.0–0.7)
Hemoglobin: 13.4 g/dL (ref 13.0–17.0)
Lymphocytes Relative: 26 % (ref 12–46)
MCH: 28.5 pg (ref 26.0–34.0)
MCHC: 36.5 g/dL — ABNORMAL HIGH (ref 30.0–36.0)
Monocytes Absolute: 1 10*3/uL (ref 0.1–1.0)
Neutrophils Relative %: 60 % (ref 43–77)
Platelets: 258 10*3/uL (ref 150–400)
RDW: 13.5 % (ref 11.5–15.5)

## 2013-03-09 LAB — RAPID URINE DRUG SCREEN, HOSP PERFORMED
Amphetamines: NOT DETECTED
Barbiturates: NOT DETECTED
Opiates: NOT DETECTED
Tetrahydrocannabinol: NOT DETECTED

## 2013-03-09 MED ORDER — PRAZOSIN HCL 1 MG PO CAPS
1.0000 mg | ORAL_CAPSULE | Freq: Every day | ORAL | Status: DC
  Administered 2013-03-09: 1 mg via ORAL
  Filled 2013-03-09 (×2): qty 1

## 2013-03-09 MED ORDER — SERTRALINE HCL 50 MG PO TABS
50.0000 mg | ORAL_TABLET | Freq: Every day | ORAL | Status: DC
  Administered 2013-03-09 – 2013-03-10 (×2): 50 mg via ORAL
  Filled 2013-03-09 (×2): qty 1

## 2013-03-09 MED ORDER — LISINOPRIL 10 MG PO TABS
5.0000 mg | ORAL_TABLET | Freq: Every day | ORAL | Status: DC
  Administered 2013-03-09: 5 mg via ORAL
  Administered 2013-03-10: 10 mg via ORAL
  Filled 2013-03-09 (×2): qty 1

## 2013-03-09 MED ORDER — CLOZAPINE 100 MG PO TABS
200.0000 mg | ORAL_TABLET | Freq: Every day | ORAL | Status: DC
  Administered 2013-03-09: 200 mg via ORAL
  Filled 2013-03-09 (×2): qty 2

## 2013-03-09 MED ORDER — LORAZEPAM 1 MG PO TABS: 1.0000 mg | ORAL_TABLET | Freq: Three times a day (TID) | ORAL | Status: DC | PRN

## 2013-03-09 MED ORDER — NICOTINE 21 MG/24HR TD PT24
21.0000 mg | MEDICATED_PATCH | Freq: Every day | TRANSDERMAL | Status: DC
  Administered 2013-03-09: 21 mg via TRANSDERMAL
  Filled 2013-03-09 (×2): qty 1

## 2013-03-09 MED ORDER — CLONAZEPAM 0.5 MG PO TABS
0.5000 mg | ORAL_TABLET | Freq: Every day | ORAL | Status: DC
  Administered 2013-03-09 – 2013-03-10 (×2): 0.5 mg via ORAL
  Filled 2013-03-09 (×2): qty 1

## 2013-03-09 MED ORDER — SILVER SULFADIAZINE 1 % EX CREA
TOPICAL_CREAM | Freq: Once | CUTANEOUS | Status: AC
  Administered 2013-03-09: 21:00:00 via TOPICAL
  Filled 2013-03-09: qty 85

## 2013-03-09 MED ORDER — IPRATROPIUM BROMIDE 0.02 % IN SOLN
0.5000 mg | Freq: Once | RESPIRATORY_TRACT | Status: AC
  Administered 2013-03-09: 0.5 mg via RESPIRATORY_TRACT
  Filled 2013-03-09: qty 2.5

## 2013-03-09 MED ORDER — ONDANSETRON HCL 8 MG PO TABS: 4.0000 mg | ORAL_TABLET | Freq: Three times a day (TID) | ORAL | Status: DC | PRN

## 2013-03-09 MED ORDER — ALBUTEROL SULFATE (5 MG/ML) 0.5% IN NEBU
2.5000 mg | INHALATION_SOLUTION | Freq: Once | RESPIRATORY_TRACT | Status: AC
  Administered 2013-03-09: 2.5 mg via RESPIRATORY_TRACT

## 2013-03-09 MED ORDER — ASPIRIN EC 325 MG PO TBEC
325.0000 mg | DELAYED_RELEASE_TABLET | Freq: Every day | ORAL | Status: DC
  Administered 2013-03-09 – 2013-03-10 (×2): 325 mg via ORAL
  Filled 2013-03-09 (×2): qty 1

## 2013-03-09 MED ORDER — ACETAMINOPHEN 325 MG PO TABS: 650.0000 mg | ORAL_TABLET | ORAL | Status: DC | PRN

## 2013-03-09 MED ORDER — FAMOTIDINE 20 MG PO TABS
20.0000 mg | ORAL_TABLET | Freq: Every day | ORAL | Status: DC
  Administered 2013-03-09 – 2013-03-10 (×2): 20 mg via ORAL
  Filled 2013-03-09 (×2): qty 1

## 2013-03-09 MED ORDER — HYDROXYZINE HCL 25 MG PO TABS
50.0000 mg | ORAL_TABLET | Freq: Three times a day (TID) | ORAL | Status: DC | PRN
  Administered 2013-03-09: 50 mg via ORAL
  Filled 2013-03-09: qty 2

## 2013-03-09 MED ORDER — ALBUTEROL SULFATE HFA 108 (90 BASE) MCG/ACT IN AERS
1.0000 | INHALATION_SPRAY | Freq: Four times a day (QID) | RESPIRATORY_TRACT | Status: DC | PRN
  Administered 2013-03-09 (×2): 2 via RESPIRATORY_TRACT
  Filled 2013-03-09: qty 6.7

## 2013-03-09 MED ORDER — ALBUTEROL SULFATE (5 MG/ML) 0.5% IN NEBU
2.5000 mg | INHALATION_SOLUTION | RESPIRATORY_TRACT | Status: DC | PRN
  Filled 2013-03-09 (×2): qty 0.5

## 2013-03-09 MED ORDER — OXCARBAZEPINE 300 MG PO TABS
600.0000 mg | ORAL_TABLET | Freq: Two times a day (BID) | ORAL | Status: DC
  Administered 2013-03-09 – 2013-03-10 (×3): 600 mg via ORAL
  Filled 2013-03-09 (×4): qty 2

## 2013-03-09 MED ORDER — CARVEDILOL 6.25 MG PO TABS
9.3750 mg | ORAL_TABLET | Freq: Two times a day (BID) | ORAL | Status: DC
  Administered 2013-03-09 – 2013-03-10 (×2): 9.375 mg via ORAL
  Filled 2013-03-09 (×4): qty 1

## 2013-03-09 MED ORDER — CLOZAPINE 100 MG PO TABS: 200.0000 mg | ORAL_TABLET | Freq: Every day | ORAL | Status: DC

## 2013-03-09 MED ORDER — BUSPIRONE HCL 10 MG PO TABS
10.0000 mg | ORAL_TABLET | Freq: Three times a day (TID) | ORAL | Status: DC
  Administered 2013-03-09 – 2013-03-10 (×3): 10 mg via ORAL
  Filled 2013-03-09 (×3): qty 1

## 2013-03-09 NOTE — Progress Notes (Signed)
Currently no beds available on the 400 hall at the [facility]. Rosey Bath, RN

## 2013-03-09 NOTE — BH Assessment (Signed)
[facility] Assessment Progress Note      Update:  [facility] [employer] and beds available per Darlene @ 1155.  Referral faxed for review.  Called [facility] and no beds per [facility].  Called [facility] and no beds per [facility] @ 1202.  Called [facility] and per Ed @ 1200, beds available.  Referral faxed for review.  Called [facility] and beds avaialble per [place] @ 1203.  Referral faxed for review.  Called [facility] and no beds per Erskine Squibb @ 1600 because unit is closed.  [facility] and no answer @ 1205.  Pt declined at [facility] per [place] @ due to needing a higher level of care.
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
## 2013-03-09 NOTE — ED Notes (Signed)
Pt requesting "to talk to psychiatrist." MD at bedside at this time.
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
## 2013-03-09 NOTE — ED Notes (Signed)
Pt also states he feels "shaky" after his home albuterol tx
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
## 2013-03-10 ENCOUNTER — Other Ambulatory Visit: Payer: Self-pay

## 2013-03-10 ENCOUNTER — Encounter (HOSPITAL_COMMUNITY): Payer: Self-pay | Admitting: Emergency Medicine

## 2013-03-10 ENCOUNTER — Inpatient Hospital Stay (HOSPITAL_COMMUNITY)
Admission: AD | Admit: 2013-03-10 | Discharge: 2013-03-15 | DRG: 885 | Disposition: A | Discharge status: Home / Self Care | Payer: Medicaid Other | Source: Intra-hospital | Attending: Psychiatry | Admitting: Psychiatry

## 2013-03-10 ENCOUNTER — Encounter (HOSPITAL_COMMUNITY): Payer: Self-pay | Admitting: *Deleted

## 2013-03-10 DIAGNOSIS — Z79899 Other long term (current) drug therapy: Secondary | ICD-10-CM

## 2013-03-10 DIAGNOSIS — F909 Attention-deficit hyperactivity disorder, unspecified type: Secondary | ICD-10-CM | POA: Diagnosis present

## 2013-03-10 DIAGNOSIS — I10 Essential (primary) hypertension: Secondary | ICD-10-CM | POA: Diagnosis present

## 2013-03-10 DIAGNOSIS — F4310 Post-traumatic stress disorder, unspecified: Secondary | ICD-10-CM | POA: Diagnosis present

## 2013-03-10 DIAGNOSIS — T300 Burn of unspecified body region, unspecified degree: Secondary | ICD-10-CM

## 2013-03-10 DIAGNOSIS — J45909 Unspecified asthma, uncomplicated: Secondary | ICD-10-CM | POA: Diagnosis present

## 2013-03-10 DIAGNOSIS — F489 Nonpsychotic mental disorder, unspecified: Secondary | ICD-10-CM | POA: Diagnosis present

## 2013-03-10 DIAGNOSIS — Z7289 Other problems related to lifestyle: Secondary | ICD-10-CM

## 2013-03-10 DIAGNOSIS — F209 Schizophrenia, unspecified: Principal | ICD-10-CM | POA: Diagnosis present

## 2013-03-10 LAB — ALT: ALT: 122 U/L — ABNORMAL HIGH (ref 0–53)

## 2013-03-10 MED ORDER — ALBUTEROL SULFATE HFA 108 (90 BASE) MCG/ACT IN AERS
1.0000 | INHALATION_SPRAY | Freq: Four times a day (QID) | RESPIRATORY_TRACT | Status: DC | PRN
  Administered 2013-03-13: 1 via RESPIRATORY_TRACT

## 2013-03-10 MED ORDER — ALBUTEROL SULFATE (5 MG/ML) 0.5% IN NEBU
2.5000 mg | INHALATION_SOLUTION | Freq: Four times a day (QID) | RESPIRATORY_TRACT | Status: DC | PRN
  Administered 2013-03-12: 2.5 mg via RESPIRATORY_TRACT

## 2013-03-10 MED ORDER — SILVER SULFADIAZINE 1 % EX CREA
TOPICAL_CREAM | Freq: Two times a day (BID) | CUTANEOUS | Status: DC
  Administered 2013-03-11 – 2013-03-15 (×8): via TOPICAL
  Filled 2013-03-10 (×2): qty 85

## 2013-03-10 MED ORDER — MAGNESIUM HYDROXIDE 400 MG/5ML PO SUSP: 30.0000 mL | Freq: Every day | ORAL | Status: DC | PRN

## 2013-03-10 MED ORDER — ALUM & MAG HYDROXIDE-SIMETH 200-200-20 MG/5ML PO SUSP: 30.0000 mL | ORAL | Status: DC | PRN

## 2013-03-10 MED ORDER — SERTRALINE HCL 50 MG PO TABS
50.0000 mg | ORAL_TABLET | Freq: Every day | ORAL | Status: DC
  Administered 2013-03-11 – 2013-03-15 (×5): 50 mg via ORAL
  Filled 2013-03-10 (×8): qty 1

## 2013-03-10 MED ORDER — ACETAMINOPHEN 325 MG PO TABS: 650.0000 mg | ORAL_TABLET | Freq: Four times a day (QID) | ORAL | Status: DC | PRN

## 2013-03-10 MED ORDER — HYDROXYZINE HCL 50 MG PO TABS: 50.0000 mg | ORAL_TABLET | Freq: Three times a day (TID) | ORAL | Status: DC | PRN

## 2013-03-10 MED ORDER — CLONAZEPAM 0.5 MG PO TABS
0.5000 mg | ORAL_TABLET | Freq: Every day | ORAL | Status: DC
  Administered 2013-03-11 – 2013-03-15 (×5): 0.5 mg via ORAL
  Filled 2013-03-10 (×6): qty 1

## 2013-03-10 MED ORDER — LISINOPRIL 5 MG PO TABS
5.0000 mg | ORAL_TABLET | Freq: Every day | ORAL | Status: DC
  Administered 2013-03-11 – 2013-03-15 (×4): 5 mg via ORAL
  Filled 2013-03-10 (×7): qty 1

## 2013-03-10 MED ORDER — PRAZOSIN HCL 1 MG PO CAPS
1.0000 mg | ORAL_CAPSULE | Freq: Every day | ORAL | Status: DC
  Administered 2013-03-10 – 2013-03-14 (×5): 1 mg via ORAL
  Filled 2013-03-10 (×8): qty 1

## 2013-03-10 MED ORDER — CARVEDILOL 3.125 MG PO TABS
9.3750 mg | ORAL_TABLET | Freq: Two times a day (BID) | ORAL | Status: DC
  Administered 2013-03-11 – 2013-03-15 (×7): 9.375 mg via ORAL
  Filled 2013-03-10 (×13): qty 1

## 2013-03-10 MED ORDER — OXCARBAZEPINE 300 MG PO TABS
600.0000 mg | ORAL_TABLET | Freq: Two times a day (BID) | ORAL | Status: DC
  Administered 2013-03-10 – 2013-03-15 (×10): 600 mg via ORAL
  Filled 2013-03-10 (×15): qty 2

## 2013-03-10 MED ORDER — CLOZAPINE 100 MG PO TABS
200.0000 mg | ORAL_TABLET | Freq: Every day | ORAL | Status: DC
  Administered 2013-03-10 – 2013-03-14 (×5): 200 mg via ORAL
  Filled 2013-03-10 (×2): qty 2
  Filled 2013-03-10 (×2): qty 1
  Filled 2013-03-10 (×5): qty 2

## 2013-03-10 NOTE — ED Notes (Signed)
Called [facility]- spoke with supervisor- room not ready at present- will go to [phone]. Pt ambulated to shower,

## 2013-03-10 NOTE — BH Assessment (Signed)
[facility] Assessment Progress Note      Consulted with Shuvon Rankin NP re admission for this patient to [facility]. He has been accepted to the 400 hall when a bed becomes available.

## 2013-03-10 NOTE — Progress Notes (Signed)
Adult Psychoeducational Group Note  Date:  03/10/2013 Time:  8:00PM Group Topic/Focus:  Wrap-Up Group:   The focus of this group is to help patients review their daily goal of treatment and discuss progress on daily workbooks.  Participation Level:  Did Not Attend   Additional Comments: Pt didn't attend group.  Bing Plume D 03/10/2013, 11:21 PM
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
## 2013-03-10 NOTE — ED Notes (Signed)
Pt selpt well.Dressing done in his rt arm.

## 2013-03-10 NOTE — ED Provider Notes (Signed)
BP 139/69  Pulse 96  Temp(Src) 98.8 F (37.1 C) (Axillary)  Resp 20  SpO2 97% Requiring PRN nebs. Pending placement.   Loren Racer, MD 03/10/13 [phone]

## 2013-03-10 NOTE — ED Notes (Signed)
To [facility] via security with sitter escort
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
## 2013-03-10 NOTE — BH Assessment (Addendum)
Assessment Note  Update:  Received call from [facility] stating pt accepted by Assunta Found, NP to Dr. Jannifer Franklin to bed 407-2 and that pt could be transported to [facility]  Updated EDP Ranae Palms and ED staff.  Completed support paperwork and faxed to [facility].  ED staff to arrange transport to [facility] as pt is voluntary.  Writer to call facilities to inform pt accepted [facility].    Update:  Called [facility] and no beds per Darlene @ [phone].  Called [facility] and no beds per [facility] @ [phone].  Called [facility] and beds available per April @ 0935.  Referral faxed for review.  Additional labs requested per April and ED nurse to arrange to get these ordered by EDP.  Called [facility] and possible beds per [facility] @ 0945.  Referral faxed for review.  Called [facility] and possibe beds available per [facility] @ [phone].  Referral faxed for review.    Disposition:  Disposition Initial Assessment Completed for this Encounter: Yes Disposition of Patient: Inpatient treatment program Type of inpatient treatment program: Adult Patient referred to: Other (Comment) (Pt accepted [facility]  On Site Evaluation by:   Reviewed with Physician:  Caryl Asp, Rennis Harding 03/10/2013 1:18 PM

## 2013-03-10 NOTE — ED Notes (Signed)
Spoke with Okey Regal-- admitting nurse from [facility] -- requested an EKG, MRSA swab-- TSH, RPR, repeat liver enzymes. Will to to Dr. Ranae Palms.

## 2013-03-10 NOTE — ED Notes (Signed)
Christopher Meadows -- [phone]  OR [phone]  (group home administrator) called --- given information regarding transfer. Guardianship -- Immunologist ([employer] [facility] [place])--

## 2013-03-11 DIAGNOSIS — F4310 Post-traumatic stress disorder, unspecified: Secondary | ICD-10-CM

## 2013-03-11 DIAGNOSIS — F411 Generalized anxiety disorder: Secondary | ICD-10-CM

## 2013-03-11 NOTE — B[facility] Group Notes (Signed)
[facility] Group Notes: (Clinical Social Work)   03/11/2013      Type of Therapy:  Group Therapy   Participation Level:  Did Not Attend    Ambrose Mantle, LCSW 03/11/2013, 5:06 PM

## 2013-03-11 NOTE — Progress Notes (Signed)
Patient ID: Christopher Meadows, male   DOB: 10-11-1991, 22 y.o.   MRN: [id number] D. The patient spent most of the evening resting in bed with eyes closed. He did come to the medication window for his HS medication. Has a flat affect and depressed mood.  A. Attempt made to awaken patient for evening group. Reviewed and administered HS medication. Offered and received an HS snack. R. The patient did not attend evening group due to sleeping. Compliant with medication. Denied any A/V hallucinations at present. Denied suicidal ideation.
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
## 2013-03-11 NOTE — BHH Group Notes (Signed)
[facility] Group Notes:  (Nursing/MHT/Case Management/Adjunct)  Date:  03/11/2013  Time:  0930  Type of Therapy:  Psychoeducational Skills  Participation Level:  Did Not Attend  Participation Quality:    Affect:    Cognitive:    Insight:    Engagement in Group:    Modes of Intervention:    Summary of Progress/Problems:  Christopher Meadows 03/11/2013, 10:41 AM

## 2013-03-11 NOTE — Progress Notes (Signed)
D: Pt had to be awaken to come for meds. Appeared flat and drowsy. Calm and cooperative. No acute distress noted. Has been isolative and spending time resting in bed. Stated he felt sleepy despite sleeping well by his own report. Did not attend AM group. Denied SI/HI/AVH and contracted for safety. Denied pain/discomfort. Offered no questions or concerns.  A: Safety has been maintained with q15 minute observation. Support and encouragement provided. Administered meds as ordered by MD. Encouraged participation. Dressing was changed to upper right arm and he tolerated well.  R: Remains safe. Compliant with meds but not programming. Offers no questions or concerns. Will continue current POC and q15 min obs.
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
## 2013-03-12 LAB — NASAL CULTURE (N/P): Culture: NORMAL

## 2013-03-12 NOTE — B[facility] Group Notes (Signed)
[facility] LCSW Group Therapy  03/12/2013 11:15AM-12:00PM  Summary of Progress/Problems:  The main focus of today's process group was to listen to a variety of genres of music and to identify that different types of music evoke different responses.  The patient then was able to identify personally what was soothing for them, as well as energizing.  Handouts were used to record feelings evoked, as well as how patient can personally use this knowledge in sleep habits, with depression, and with other symptoms.  The patient expressed fluently what emotions resulted from the various types of music played.  He stayed alert and engaged throughout group.  Type of Therapy:  Music Therapy  Participation Level:  Active  Participation Quality:  Attentive and Sharing  Affect:  Blunted  Cognitive:  Appropriate  Insight:  Engaged  Engagement in Therapy:  Engaged  Modes of Intervention:  Activity  Sarina Ser 03/12/2013, 12:44 PM

## 2013-03-12 NOTE — Progress Notes (Signed)
Adult Psychoeducational Group Note  Date:  03/12/2013 Time:  4:28 AM  Group Topic/Focus:  Wrap-Up Group:   The focus of this group is to help patients review their daily goal of treatment and discuss progress on daily workbooks.  Participation Level:  Minimal  Participation Quality:  Appropriate  Affect:  Labile  Cognitive:  Lacking  Insight: Lacking  Engagement in Group:  Engaged  Modes of Intervention:  Support  Additional Comments:  Patient attended and participated in group tonight. He reports sleeping all day. He went to lunch and dinner. He was unable to attend breakfast because he was too sleepy. The patient advised that he stay sleepy all the time. He sometimes sleep while he is standing.  Lita Mains [facility] 03/12/2013, 4:28 AM

## 2013-03-12 NOTE — Progress Notes (Signed)
Patient ID: Christopher Meadows, male   DOB: 26-Aug-1991, 22 y.o.   MRN: [id number]    S:  Seen today. Less sleepy and more interactive. Feels better overall and thinks he might be ready to leave soon. Wants to stay in same group home and thinks it is good. Per pt he learned some coping skills here and feels better now. Per pt he did not feel safe when he came so he sought help for safety but it is better now.   MSE:   Appearance: on bed  Eye Contact:: fair Speech: limited  Volume: low  Mood: better  Affect: ristricted  Thought Process: logical Orientation: 4/4  Thought Content: denies AVH  Suicidal Thoughts: No  Homicidal Thoughts: no  Memory: Recent; Poor  Judgement: Impaired  Insight: Lacking  Psychomotor Activity: slow  Concentration: poor  Recall: poor  Akathisia: No    Assessment:  AXIS I: Generalized Anxiety Disorder and Post Traumatic Stress Disorder, r/o psychosis, r/o dep  AXIS II: Borderline IQ  AXIS III:  Past Medical History   Diagnosis  Date   .  PTSD (post-traumatic stress disorder)    .  Stomach ulcer    .  Personality disorder      with borderline antisocial traits, with poor social skills,   .  Hypertension    .  Asthma    .  ADHD (attention deficit hyperactivity disorder)     AXIS IV: other psychosocial or environmental problems  AXIS V: 21-30 behavior considerably influenced by delusions or hallucinations OR serious impairment in judgment, communication OR inability to function in almost all areas   Treatment Plan/Recommendations:   1. Continue current hx meds  2. expand hx including placement for safety

## 2013-03-12 NOTE — Progress Notes (Signed)
Patient ID: Christopher Meadows, male   DOB: Jul 06, 1991, 22 y.o.   MRN: [id number] D)  Was in his room this evening, resting. Stated he stays tired all the time, can hardly get out of bed in the morning.  States feels dizzy at times and feels like he needs to lie down frequently, but agreed to walk down to the dayroom for group.  When in group, talked about  How tired he feels also, was encouraged to tell his MD, as he stated he missed breakfast and most groups.   Also talked about having a seizure d/o and that he had a seizure last week in [employer].  Was given red socks and a fall risk bracelet, safety precautions for falls reviewed, and he verbalized understanding.  Got a snack and went to his room, hs meds were taken to him. A)  Will continue to monitor for safety, continue POC.  R)  Safety maintained.
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
## 2013-03-12 NOTE — Progress Notes (Signed)
Patient ID: Christopher Meadows, male   DOB: 09-Apr-1991, 22 y.o.   MRN: [id number] He was in bed till late AM and he took his 8 AM medication at that time except  the coreg that was held per nursing judgement  He will get one at 5 PM. He has been up for short periods of tim then back to bed says that he is tired. B/P 116/75 p 90 sitting standing 130/76 p 100.

## 2013-03-12 NOTE — BHH Group Notes (Signed)
BHH Group Notes:  (Nursing/MHT/Case Management/Adjunct)  Date:  03/12/2013  Time:  8:48 PM  Type of Therapy:  Group Therapy  Participation Level:  Active  Participation Quality:  Appropriate  Affect:  Appropriate  Cognitive:  Appropriate  Insight:  Appropriate  Engagement in Group:  Engaged  Modes of Intervention:  Discussion and Education  Summary of Progress/Problems:  Pt's states that his goal for the day was to attend 1 day group today.  Pt states that he successfully met his goal.

## 2013-03-13 DIAGNOSIS — F4310 Post-traumatic stress disorder, unspecified: Secondary | ICD-10-CM

## 2013-03-13 DIAGNOSIS — F209 Schizophrenia, unspecified: Principal | ICD-10-CM | POA: Diagnosis present

## 2013-03-13 DIAGNOSIS — Z7289 Other problems related to lifestyle: Secondary | ICD-10-CM | POA: Diagnosis present

## 2013-03-13 DIAGNOSIS — F489 Nonpsychotic mental disorder, unspecified: Secondary | ICD-10-CM

## 2013-03-13 LAB — WOUND CULTURE: Gram Stain: NONE SEEN

## 2013-03-13 NOTE — Progress Notes (Signed)
D: Patient in the dayroom on approach.  Patient states he is doing all right.  Patient states he learned that you should not judge a book by its cover.  Patient denies SI/HI and denies AVH.   A: Staff to monitor Q 15 mins for safety.  Encouragement and support offered.  Scheduled medications administered per orders.  Right arm dressing changed. R: Patient remains safe on the unit.  Patient attended group tonight.  Patient cooperative, calm and taking administered medications.  Patient visible on the unit and interacting with peers.

## 2013-03-13 NOTE — Progress Notes (Signed)
[facility] MD Progress Note  03/13/2013 12:47 PM Christopher Meadows  MRN:  [id number] Subjective:  "I'm tired." Patient is resting in bed. Reports good sleep, good appetite. Denies new problems. Objective: patient resting in bed, dressed, poor eye contact, cooperative, poverty of speech. States he is attending groups and may be ready to go home soon.  Reports his burn is currently covered by a bandage.  Diagnosis:  PTSD, Schizophrenia, Self mutilation3  ADL's:  Impaired  Sleep: Good  Appetite:  Good  Suicidal Ideation:  denies Homicidal Ideation:  denies AEB (as evidenced by): patient's reports of decreasing symptoms, improved sleep, normal appetite, mood and affect.  Psychiatric Specialty Exam: Review of Systems  Constitutional: Negative.  Negative for fever, chills, weight loss, malaise/fatigue and diaphoresis.  HENT: Negative for congestion and sore throat.   Eyes: Negative for blurred vision, double vision and photophobia.  Respiratory: Negative for cough, shortness of breath and wheezing.   Cardiovascular: Negative for chest pain, palpitations and PND.  Gastrointestinal: Negative for heartburn, nausea, vomiting, abdominal pain, diarrhea and constipation.  Musculoskeletal: Negative for myalgias, joint pain and falls.  Neurological: Negative for dizziness, tingling, tremors, sensory change, speech change, focal weakness, seizures, loss of consciousness, weakness and headaches.  Endo/Heme/Allergies: Negative for polydipsia. Does not bruise/bleed easily.  Psychiatric/Behavioral: Negative for depression, suicidal ideas, hallucinations, memory loss and substance abuse. The patient is not nervous/anxious and does not have insomnia.     Blood pressure 103/64, pulse 73, temperature 97.8 F (36.6 C), temperature source Oral, resp. rate 16, height 5\' 9"  (1.753 m), weight 127.007 kg (280 lb), SpO2 96.00%.Body mass index is 41.33 kg/(m^2).  General Appearance: Disheveled  Eye Solicitor::  Fair   Speech:  Normal Rate  Volume:  Normal  Mood:  Depressed  Affect:  Congruent  Thought Process:  Disorganized  Orientation:  Other:  2/3  Thought Content:  Hallucinations: Auditory  Suicidal Thoughts:  No  Homicidal Thoughts:  No  Memory:  Immediate;   Fair  Judgement:  Intact  Insight:  Present  Psychomotor Activity:  Normal  Concentration:  Fair  Recall:  Fair  Akathisia:  No  Handed:  Right  AIMS (if indicated):     Assets:  Communication Skills Desire for Improvement Housing Physical Health Resilience Social Support  Sleep:  Number of Hours: 6.75   Current Medications: Current Facility-Administered Medications  Medication Dose Route Frequency Provider Last Rate Last Dose  . acetaminophen (TYLENOL) tablet 650 mg  650 mg Oral Q6H PRN Court Joy, PA-C      . albuterol (PROVENTIL HFA;VENTOLIN HFA) 108 (90 BASE) MCG/ACT inhaler 1-2 puff  1-2 puff Inhalation Q6H PRN Court Joy, PA-C      . albuterol (PROVENTIL) (5 MG/ML) 0.5% nebulizer solution 2.5 mg  2.5 mg Nebulization Q6H PRN Court Joy, PA-C   2.5 mg at 03/12/13 1408  . alum & mag hydroxide-simeth (MAALOX/MYLANTA) 200-200-20 MG/5ML suspension 30 mL  30 mL Oral Q4H PRN Court Joy, PA-C      . carvedilol (COREG) tablet 9.375 mg  9.375 mg Oral BID WC Court Joy, PA-C   9.375 mg at 03/12/13 1728  . clonazePAM (KLONOPIN) tablet 0.5 mg  0.5 mg Oral Daily Court Joy, PA-C   0.5 mg at 03/13/13 0843  . cloZAPine (CLOZARIL) tablet 200 mg  200 mg Oral QHS Court Joy, PA-C   200 mg at 03/12/13 2237  . hydrOXYzine (ATARAX/VISTARIL) tablet 50 mg  50 mg Oral TID PRN Leonette Most  Peggye Fothergill, PA-C      . lisinopril (PRINIVIL,ZESTRIL) tablet 5 mg  5 mg Oral Daily Court Joy, PA-C   5 mg at 03/12/13 1226  . magnesium hydroxide (MILK OF MAGNESIA) suspension 30 mL  30 mL Oral Daily PRN Court Joy, PA-C      . Oxcarbazepine (TRILEPTAL) tablet 600 mg  600 mg Oral BID Court Joy, PA-C   600 mg at 03/13/13  0844  . prazosin (MINIPRESS) capsule 1 mg  1 mg Oral QHS Court Joy, PA-C   1 mg at 03/12/13 2237  . sertraline (ZOLOFT) tablet 50 mg  50 mg Oral Daily Court Joy, PA-C   50 mg at 03/13/13 0843  . silver sulfADIAZINE (SILVADENE) 1 % cream   Topical BID Court Joy, PA-C        Lab Results: No results found for this or any previous visit (from the past 48 hour(s)).  Physical Findings: AIMS: Facial and Oral Movements Muscles of Facial Expression: None, normal Lips and Perioral Area: None, normal Jaw: None, normal Tongue: None, normal,Extremity Movements Upper (arms, wrists, hands, fingers): None, normal Lower (legs, knees, ankles, toes): None, normal, Trunk Movements Neck, shoulders, hips: None, normal, Overall Severity Severity of abnormal movements (highest score from questions above): None, normal Incapacitation due to abnormal movements: None, normal Patient's awareness of abnormal movements (rate only patient's report): No Awareness, Dental Status Current problems with teeth and/or dentures?: No Does patient usually wear dentures?: No  CIWA:    COWS:     Treatment Plan Summary: Daily contact with patient to assess and evaluate symptoms and progress in treatment Medication management  Plan: 1. Patient is responding well to medication so will continue current management of medications with no changes at this time. 2. Will recommend D/C Tues or Wednesday if continues to improve. 3. Recommend continued wound care as ordered.  Medical Decision Making Problem Points:  Established problem, stable/improving (1) Data Points:  Review or order medicine tests (1)  I certify that inpatient services furnished can reasonably be expected to improve the patient's condition.  Rona Ravens. Geraldy Akridge RPAC 2:14 PM 03/13/2013

## 2013-03-13 NOTE — Progress Notes (Signed)
Patient ID: Christopher Meadows, male   DOB: 02-15-1991, 22 y.o.   MRN: [id number] D)  Was out in the dayroom earlier this evening, watching tv with older male peer, seemed to be enjoying the show about an alien.  States still feels tired today and just not quite right, but couldn't really say what was wrong.  Attended group, then went back to his room afterward and got ready for bed.  HS meds were taken to him, as he had already gotten in bed.  Was appreciative. A)  Will continue to monitor for safety, encourage group attendance and participation, was given a journal so that he could write down his thoughts and questions for his MD R)  Remains safe on unit at this time.
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
## 2013-03-13 NOTE — Clinical Social Work Note (Signed)
  Type of Therapy: Process Group Therapy  Participation Level:  Did Not Attend    Summary of Progress/Problems: Today's group addressed the issue of overcoming obstacles.  Patients were asked to identify their biggest obstacle post d/c that stands in the way of their on-going success, and then problem solve as to how to manage this.       Ida Rogue 03/13/2013   3:44 PM

## 2013-03-13 NOTE — Progress Notes (Signed)
The focus of this group is to help patients review their daily goal of treatment and discuss progress on daily workbooks. Pt attended the evening group session and responded to all discussion prompts from the Writer. Pt stated that today was his best day yet here because he felt he was finally getting adjusted to his medications. Pt reported not feeling dizzy or drowsy at all today and being awake for all of the groups. Pt stated that he had a good day for this reason and because of the good meals in the cafeteria. Pt smiled and laughed throughout group.

## 2013-03-13 NOTE — Progress Notes (Signed)
D:  Per pt self inventory pt reports sleeping fair, appetite good, energy level low, ability to pay attention improving, rates depression at a 0 out of 10 and hopelessness at a 0 out of 10, pt was very reluctant to get OOB this am to go to group, denies SI/HI/AVH, denies pain at this time.    A:  Emotional support provided, Encouraged pt to continue with treatment plan and attend all group activities, q15 min checks maintained for safety.  R:  Pt did go to am group, but is still tired, calm at this time.

## 2013-03-13 NOTE — Progress Notes (Signed)
Recreation Therapy Notes  Date: 04.21.2014  Time: 9:30am  Location: [address] Day Room   Group Topic/Focus: Self Expression   Participation Level:  Did not attend  Jearl Klinefelter, LRT/CTRS  Jearl Klinefelter 03/13/2013 12:35 PM

## 2013-03-13 NOTE — BHH Group Notes (Signed)
[facility] LCSW Aftercare Discharge Planning Group Note   03/13/2013 3:34 PM  Participation Quality:  Came in to the room with 5 minutes remaining in group.  Mood/Affect:  Flat  Depression Rating:  Unknown   Anxiety Rating:  unknown  Thoughts of Suicide:  No Will you contract for safety?   NA  Current AVH:  No  Plan for Discharge/Comments:  Clemens states he plans to go back to the same group home.  He says they indicated that he needs to be here for 4 days or so to get stabilized, and then he can go back.  I will call to confirm.  Transportation Means: [facility]  Supports: [facility]  [name]

## 2013-03-14 LAB — CBC WITH DIFFERENTIAL/PLATELET
Basophils Absolute: 0 10*3/uL (ref 0.0–0.1)
Basophils Relative: 1 % (ref 0–1)
Hemoglobin: 13.7 g/dL (ref 13.0–17.0)
MCHC: 33.6 g/dL (ref 30.0–36.0)
Monocytes Relative: 6 % (ref 3–12)
Neutro Abs: 4.3 10*3/uL (ref 1.7–7.7)
Neutrophils Relative %: 49 % (ref 43–77)
WBC: 8.9 10*3/uL (ref 4.0–10.5)

## 2013-03-14 MED ORDER — CHLORDIAZEPOXIDE HCL 25 MG PO CAPS: 25.0000 mg | ORAL_CAPSULE | Freq: Three times a day (TID) | ORAL | Status: DC | PRN

## 2013-03-14 MED ORDER — SULFAMETHOXAZOLE-TMP DS 800-160 MG PO TABS
1.0000 | ORAL_TABLET | Freq: Two times a day (BID) | ORAL | Status: DC
  Administered 2013-03-14 – 2013-03-15 (×2): 1 via ORAL
  Filled 2013-03-14 (×6): qty 1

## 2013-03-14 NOTE — Progress Notes (Signed)
D: Pt denies SI/HI/AVH. Pt denies pain and show no s/s of distress. Pt is engaged on the milieu and is compliant with treatment. Per pt, he will be discharged tomorrow. Pt dressing to left deltoid is dry and intact.  A: Verbal support given. Pt encouraged to attend groups. Dressing to left deltoid assessed. Per report, dressing was changed today by RN.   R: Pt is receptive to treatment. Pt is cooperative.
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
## 2013-03-14 NOTE — [facility] Group Notes (Signed)
[facility] LCSW Aftercare Discharge Planning Group Note   03/14/2013 10:17 AM  Participation Quality:  Did not attend  Mood/Affect:    Depression Rating:    Anxiety Rating:    Thoughts of Suicide:  Will you contract for safety?     Current AVH:    Plan for Discharge/Comments:    Transportation Means:   Supports:  [name]

## 2013-03-14 NOTE — Clinical Social Work Note (Signed)
[facility] Group Notes:  (Counselor/Nursing/MHT/Case Management/Adjunct)  10/06/2012 2:20 PM  Type of Therapy:  Group Therapy  Participation Level:  Active  Participation Quality:  Attentive  Affect:  Appropriate  Cognitive:  Oriented  Insight:  Improving  Engagement in Group:  Engaged  Engagement in Therapy:  Limited  Modes of Intervention:  Clarification and Discussion  Summary of Progress/Problems: .balance: The topic for group was balance in life.  Pt participated in the discussion about when their life was in balance and out of balance and how this feels.  Pt discussed ways to get back in balance and short term goals they can work on to get where they want to be. Josh volunteered to come up with a balance/coping phrase for others to guess.  He did well with that activity, and demonstrated good insight.  He stated that he sometimes needs time alone to help him cope and find balance.     [name] 10/06/2012, 2:20 PM

## 2013-03-15 MED ORDER — OXCARBAZEPINE 600 MG PO TABS: 600.0000 mg | ORAL_TABLET | Freq: Two times a day (BID) | ORAL | Status: AC

## 2013-03-15 MED ORDER — CLONAZEPAM 0.5 MG PO TABS: 0.5000 mg | ORAL_TABLET | Freq: Every day | ORAL | Status: DC

## 2013-03-15 MED ORDER — ASPIRIN EC 325 MG PO TBEC: 325.0000 mg | DELAYED_RELEASE_TABLET | Freq: Every day | ORAL | Status: AC

## 2013-03-15 MED ORDER — LISINOPRIL 5 MG PO TABS: 5.0000 mg | ORAL_TABLET | Freq: Every day | ORAL | Status: AC

## 2013-03-15 MED ORDER — SULFAMETHOXAZOLE-TMP DS 800-160 MG PO TABS: 1.0000 | ORAL_TABLET | Freq: Two times a day (BID) | ORAL | Status: DC

## 2013-03-15 MED ORDER — CARVEDILOL 3.125 MG PO TABS: 9.3750 mg | ORAL_TABLET | Freq: Two times a day (BID) | ORAL | Status: AC

## 2013-03-15 MED ORDER — "TELFA CLEAR WOUND 3""X3"" PADS": MEDICATED_PAD | Status: AC

## 2013-03-15 MED ORDER — SULFAMETHOXAZOLE-TMP DS 800-160 MG PO TABS: 1.0000 | ORAL_TABLET | Freq: Two times a day (BID) | ORAL | Status: AC

## 2013-03-15 MED ORDER — CLOZAPINE 200 MG PO TABS: 200.0000 mg | ORAL_TABLET | Freq: Every day | ORAL | Status: AC

## 2013-03-15 MED ORDER — PRAZOSIN HCL 1 MG PO CAPS: 1.0000 mg | ORAL_CAPSULE | Freq: Every day | ORAL | Status: AC

## 2013-03-15 MED ORDER — SERTRALINE HCL 50 MG PO TABS
50.0000 mg | ORAL_TABLET | Freq: Every day | ORAL | Status: AC
Start: 2013-03-15 — End: ?

## 2013-03-15 MED ORDER — SILVER SULFADIAZINE 1 % EX CREA: TOPICAL_CREAM | Freq: Every day | CUTANEOUS | Status: AC

## 2013-03-15 MED ORDER — CLONAZEPAM 0.5 MG PO TABS: 0.5000 mg | ORAL_TABLET | Freq: Every day | ORAL | Status: AC

## 2013-03-15 MED ORDER — RANITIDINE HCL 150 MG PO TABS: 150.0000 mg | ORAL_TABLET | Freq: Every day | ORAL | Status: AC
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
## 2013-03-15 NOTE — Progress Notes (Signed)
Adult Psychoeducational Group Note  Date:  03/15/2013 Time:  11:00am  Group Topic/Focus:  Crisis Planning:   The purpose of this group is to help patients create a crisis plan for use upon discharge or in the future, as needed.  Participation Level:  Did Not Attend  Participation Quality:    Affect:    Cognitive:    Insight:   Engagement in Group:    Modes of Intervention:    Additional Comments:  Pt was sleeping, pt refused to attend. Pt was resting before pt got discharged to day.  Isla Pence M 03/15/2013, 5:35 PM

## 2013-03-15 NOTE — Progress Notes (Signed)
[facility] Adult Case Management Discharge Plan :  Will you be returning to the same living situation after discharge: Yes,  group home At discharge, do you have transportation home?:Yes,  [facility] Do you have the ability to pay for your medications:Yes,  MCD  Release of information consent forms completed and in the chart;  Patient's signature needed at discharge.  Patient to Follow up at: Follow-up Information   Follow up with [facility]. (Walk in Monday through Friday between 8AM-9AM for hospital follow-up)    Contact information:   [address]  [place] [336] [phone]      Patient denies SI/HI:   Yes,  yes    Safety Planning and Suicide Prevention discussed:  Yes,  yes  Christopher Meadows 03/15/2013, 1:20 PM

## 2013-03-15 NOTE — [facility] Suicide Risk Assessment (Signed)
Suicide Risk Assessment  Discharge Assessment     Demographic Factors:  Male, Adolescent or young adult, Low socioeconomic status and Unemployed  Mental Status Per Nursing Assessment::   On Admission:  Self-harm thoughts;Self-harm behaviors  Current Mental Status by Physician: patient denies suicidal ideation,intent and plan  Loss Factors: Decrease in vocational status and Financial problems/change in socioeconomic status  Historical Factors: NA  Risk Reduction Factors:   Positive social support and Positive therapeutic relationship  Continued Clinical Symptoms:  Resolving psychosis and delusions   Cognitive Features That Contribute To Risk:  Closed-mindedness Loss of executive function Polarized thinking Thought constriction (tunnel vision)    Suicide Risk:  Minimal: No identifiable suicidal ideation.  Patients presenting with no risk factors but with morbid ruminations; may be classified as minimal risk based on the severity of the depressive symptoms  Discharge Diagnoses:   AXIS I:  Schizophrenia-undifferentiated               PTSD AXIS II:  Borderline IQ AXIS III:   Past Medical History  Diagnosis Date  . PTSD (post-traumatic stress disorder)   . Stomach ulcer   . Personality disorder     with borderline antisocial traits, with poor social skills,   . Hypertension   . Asthma   . ADHD (attention deficit hyperactivity disorder)    AXIS IV:  other psychosocial or environmental problems and problems related to social environment AXIS V:  61-70 mild symptoms  Plan Of Care/Follow-up recommendations:  Activity:  as tolerated Diet:  healthy Tests:  routine blood test Other:  patient to keep his after care appointment.  Is patient on multiple antipsychotic therapies at discharge:  No   Has Patient had three or more failed trials of antipsychotic monotherapy by history:  No  Recommended Plan for Multiple Antipsychotic Therapies: N/A  Kjersti Dittmer,  Johanny Segers,MD 03/15/2013, 9:34 AM

## 2013-03-15 NOTE — Progress Notes (Signed)
Patient ID: Christopher Meadows, male   DOB: 03-14-1991, 22 y.o.   MRN: [id number] Discharged note: pt denies SI/HI/AVH. Pt received both written and verbal discharged instructions. Pt agreed to f/u appointment and med regimen. Pt received all belongings from room. Pt had no belongings in a locker. Pt left via group home staff member. Pt cooperative during session.

## 2013-03-15 NOTE — Progress Notes (Signed)
D: Pt in bed resting with eyes closed. Respirations even and unlabored. Pt is however heard snoring. Pt appears to be in no signs of distress at this time. A: Q93min checks remains for this pt. R: Pt remains safe at this time.

## 2013-03-15 NOTE — [facility] Suicide Risk Assessment (Signed)
[facility] INPATIENT:  Family/Significant Other Suicide Prevention Education  Suicide Prevention Education:  Patient Discharged to Other Healthcare Facility:  Suicide Prevention Education Not Provided: Christopher Meadows will be going back to his group home.  They will pick him up at d/c from the hospital.  The patient is discharging to another healthcare facility for continuation of treatment.  The patient's medical information, including suicide ideations and risk factors, are a part of the medical information shared with the receiving healthcare facility.  [name] 03/15/2013, 1:21 PM

## 2013-03-15 NOTE — Tx Team (Signed)
Interdisciplinary Treatment Plan Update  Date Reviewed:  03/15/2013  Time Reviewed:  11:22 AM  Progress in Treatment:  Attending groups: Sporaidcally  Participating in groups: Minimally  Taking medication as prescribed: Yes  Tolerating medication: Yes  Family/Significant other contact made: No  Patient understands diagnosis: Yes As evidenced by asking for help with depression, thoughts of self harm  Discussing patient identified problems/goals with staff: Yes  Medical problems stabilized or resolved: Yes  Denies suicidal/homicidal ideation: Yes In tx team  Patient has not harmed self or others: Yes  For review of initial/current patient goals, please see plan of care.  Estimated Length of Stay: d/c today  Reason for Continuation of Hospitalization: none-d/c today New Problems/Goals identified: N/A  Discharge Plan or Barriers: return to [facility] and follow up at [facility] for med management.  Additional Comments: none  Attendees:  Signature: Thedore Mins, MD  03/15/2013 11:23 AM   Signature: Richelle Ito, LCSW  03/15/2013 11:23 AM   Signature: Verne Spurr, PA  03/15/2013 11:23 AM   Signature: Donovan Kail RN 03/15/2013 11:23 AM   Signature: Trula Slade, MSW intern 03/15/2013 11:24 AM   Signature:    Signature:    Signature:    Signature:    Signature:    Signature:    Signature:    Signature:    Scribe for Treatment Team:  Trula Slade, MSW intern, 03/15/2013 11:24 AM

## 2013-03-15 NOTE — B[facility] Group Notes (Signed)
[facility] LCSW Aftercare Discharge Planning Group Note   03/15/2013 10:13 AM  Participation Quality:  DID NOT ATTEND.   Smart, Ledell Peoples

## 2013-03-15 NOTE — Progress Notes (Signed)
Recreation Therapy Notes   Date: 04.23.2014  Time: 9:30am Location: [address] Day Room      Group Topic/Focus: Leisure Education & Memory  Participation Level: Did not attend.   Marykay Lex Emmagene Ortner, LRT/CTRS  Jearl Klinefelter 03/15/2013 12:49 PM

## 2013-03-15 NOTE — Progress Notes (Signed)
Pt in bed sleeping this morning. Pt was asked several times to wake up to take meds. Pt continued to sleep this morning and has not received morning meds.

## 2013-03-16 NOTE — Discharge Summary (Signed)
Seen and agreed. Myelle Poteat, MD 

## 2013-03-20 NOTE — Progress Notes (Signed)
Patient Discharge Instructions:  After Visit Summary (AVS):   Faxed to:  03/20/13 Discharge Summary Note:   Faxed to:  03/20/13 Psychiatric Admission Assessment Note:   Faxed to:  03/20/13 Suicide Risk Assessment - Discharge Assessment:   Faxed to:  03/20/13 Faxed/Sent to the Next Level Care provider:  03/20/13 Faxed to [facility] @ [phone]  Jerelene Redden, 03/20/2013, 3:31 PM

## 2013-04-25 ENCOUNTER — Emergency Department (HOSPITAL_COMMUNITY)
Admission: EM | Admit: 2013-04-25 | Discharge: 2013-04-26 | Disposition: A | Discharge status: Home / Self Care | Payer: Medicaid Other | Attending: Emergency Medicine | Admitting: Emergency Medicine

## 2013-04-25 ENCOUNTER — Encounter (HOSPITAL_COMMUNITY): Payer: Self-pay | Admitting: *Deleted

## 2013-04-25 DIAGNOSIS — F17200 Nicotine dependence, unspecified, uncomplicated: Secondary | ICD-10-CM | POA: Insufficient documentation

## 2013-04-25 DIAGNOSIS — F909 Attention-deficit hyperactivity disorder, unspecified type: Secondary | ICD-10-CM | POA: Insufficient documentation

## 2013-04-25 DIAGNOSIS — Z8711 Personal history of peptic ulcer disease: Secondary | ICD-10-CM | POA: Insufficient documentation

## 2013-04-25 DIAGNOSIS — Z79899 Other long term (current) drug therapy: Secondary | ICD-10-CM | POA: Insufficient documentation

## 2013-04-25 DIAGNOSIS — Z7982 Long term (current) use of aspirin: Secondary | ICD-10-CM | POA: Insufficient documentation

## 2013-04-25 DIAGNOSIS — L723 Sebaceous cyst: Secondary | ICD-10-CM | POA: Insufficient documentation

## 2013-04-25 DIAGNOSIS — J45909 Unspecified asthma, uncomplicated: Secondary | ICD-10-CM | POA: Insufficient documentation

## 2013-04-25 DIAGNOSIS — F4310 Post-traumatic stress disorder, unspecified: Secondary | ICD-10-CM | POA: Insufficient documentation

## 2013-04-25 DIAGNOSIS — F609 Personality disorder, unspecified: Secondary | ICD-10-CM | POA: Insufficient documentation

## 2013-04-25 DIAGNOSIS — I10 Essential (primary) hypertension: Secondary | ICD-10-CM | POA: Insufficient documentation
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
## 2013-04-25 NOTE — ED Notes (Signed)
Lump behind left ear x 1 day

## 2013-04-25 NOTE — ED Notes (Signed)
Pt ambulatory to exam room with steady gait. Pt requesting to lie down in gurney.

## 2013-04-25 NOTE — ED Notes (Signed)
Pt from [facility] [address]

## 2013-04-26 NOTE — ED Notes (Signed)
Pt resting v/s stable no c/o pain. Will monitor.

## 2013-04-26 NOTE — ED Notes (Signed)
Spoke with pt's caregiver, Brett Canales, at group home. He stated that pt will probably need to take a cab back to the group home. He requested that we call him upon discharge of the pt.

## 2013-04-26 NOTE — ED Notes (Signed)
Pt staying to be monitored until he is awake enough to return to group home. Call Brett Canales at [facility] at [phone]. Pt can take a cab home to be paid for when he gets there or if it is early in morning Brett Canales can come get him.

## 2013-04-26 NOTE — ED Notes (Signed)
Pt arouses easily to physical stimulation. Pt able to transfer himself to the other gurney in [place]. Pt with no acute distress. Breaths even/unlabored.

## 2013-04-26 NOTE — ED Notes (Signed)
rn called group home, spoke with Brett Canales who stated they would come around 0915 to pick up pt. [phone]

## 2013-04-26 NOTE — ED Notes (Signed)
Pt resting will continue to monitor.  

## 2013-04-27 NOTE — ED Provider Notes (Signed)
Medical screening examination/treatment/procedure(s) were performed by non-physician practitioner and as supervising physician I was immediately available for consultation/collaboration.  Raeford Razor, MD 04/27/13 0530

## 2013-08-07 ENCOUNTER — Emergency Department: Payer: Self-pay | Admitting: Emergency Medicine

## 2013-10-18 ENCOUNTER — Emergency Department: Payer: Self-pay | Admitting: Emergency Medicine

## 2014-01-04 LAB — COMPREHENSIVE METABOLIC PANEL
ALK PHOS: 119 U/L — AB
ALT: 201 U/L — AB (ref 12–78)
Albumin: 4.1 g/dL (ref 3.4–5.0)
Anion Gap: 5 — ABNORMAL LOW (ref 7–16)
BILIRUBIN TOTAL: 0.3 mg/dL (ref 0.2–1.0)
BUN: 15 mg/dL (ref 7–18)
Calcium, Total: 9 mg/dL (ref 8.5–10.1)
Chloride: 105 mmol/L (ref 98–107)
Co2: 27 mmol/L (ref 21–32)
Creatinine: 0.84 mg/dL (ref 0.60–1.30)
EGFR (African American): >60
GLUCOSE: 127 mg/dL — AB (ref 65–99)
Osmolality: 276 (ref 275–301)
Potassium: 4.1 mmol/L (ref 3.5–5.1)
SGOT(AST): 80 U/L — ABNORMAL HIGH (ref 15–37)
SODIUM: 137 mmol/L (ref 136–145)
TOTAL PROTEIN: 7.1 g/dL (ref 6.4–8.2)

## 2014-01-04 LAB — DRUG SCREEN, URINE
AMPHETAMINES, UR SCREEN: NEGATIVE (ref ?–1000)
BARBITURATES, UR SCREEN: NEGATIVE (ref ?–200)
BENZODIAZEPINE, UR SCRN: NEGATIVE (ref ?–200)
CANNABINOID 50 NG, UR ~~LOC~~: NEGATIVE (ref ?–50)
Cocaine Metabolite,Ur ~~LOC~~: NEGATIVE (ref ?–300)
MDMA (ECSTASY) UR SCREEN: NEGATIVE (ref ?–500)
Methadone, Ur Screen: NEGATIVE (ref ?–300)
OPIATE, UR SCREEN: NEGATIVE (ref ?–300)
Phencyclidine (PCP) Ur S: NEGATIVE (ref ?–25)
TRICYCLIC, UR SCREEN: NEGATIVE (ref ?–1000)

## 2014-01-04 LAB — URINALYSIS, COMPLETE
BACTERIA: NONE SEEN
BILIRUBIN, UR: NEGATIVE
BLOOD: NEGATIVE
Glucose,UR: NEGATIVE mg/dL (ref 0–75)
Ketone: NEGATIVE
Leukocyte Esterase: NEGATIVE
NITRITE: NEGATIVE
PH: 5 (ref 4.5–8.0)
Protein: NEGATIVE
SPECIFIC GRAVITY: 1.021 (ref 1.003–1.030)
Squamous Epithelial: NONE SEEN
WBC UR: 1 /HPF (ref 0–5)

## 2014-01-04 LAB — CBC
HCT: 43.7 % (ref 40.0–52.0)
HGB: 14.9 g/dL (ref 13.0–18.0)
MCH: 29.9 pg (ref 26.0–34.0)
MCHC: 34 g/dL (ref 32.0–36.0)
MCV: 88 fL (ref 80–100)
PLATELETS: 286 10*3/uL (ref 150–440)
RBC: 4.98 10*6/uL (ref 4.40–5.90)
RDW: 13.5 % (ref 11.5–14.5)
WBC: 14.4 10*3/uL — AB (ref 3.8–10.6)

## 2014-01-04 LAB — ETHANOL
Ethanol %: <0.003 % (ref 0.000–0.080)
Ethanol: <3 mg/dL

## 2014-01-04 LAB — ACETAMINOPHEN LEVEL: Acetaminophen: <2 ug/mL

## 2014-01-04 LAB — SALICYLATE LEVEL: Salicylates, Serum: <1.7 mg/dL

## 2014-01-05 ENCOUNTER — Inpatient Hospital Stay: Payer: Self-pay | Admitting: Psychiatry

## 2014-01-05 LAB — DIFFERENTIAL
BASOS PCT: 0.8 %
Basophil #: 0.1 10*3/uL (ref 0.0–0.1)
Eosinophil #: 1.3 10*3/uL — ABNORMAL HIGH (ref 0.0–0.7)
Eosinophil %: 8.8 %
LYMPHS PCT: 28.3 %
Lymphocyte #: 4.1 10*3/uL — ABNORMAL HIGH (ref 1.0–3.6)
MONOS PCT: 7.2 %
Monocyte #: 1 x10 3/mm (ref 0.2–1.0)
Neutrophil #: 7.9 10*3/uL — ABNORMAL HIGH (ref 1.4–6.5)
Neutrophil %: 54.9 %

## 2014-01-06 LAB — HEPATIC FUNCTION PANEL A (ARMC)
Albumin: 4.2 g/dL (ref 3.4–5.0)
Alkaline Phosphatase: 126 U/L — ABNORMAL HIGH
Bilirubin,Total: 0.3 mg/dL (ref 0.2–1.0)
SGOT(AST): 68 U/L — ABNORMAL HIGH (ref 15–37)
SGPT (ALT): 181 U/L — ABNORMAL HIGH (ref 12–78)
TOTAL PROTEIN: 7.2 g/dL (ref 6.4–8.2)

## 2014-01-07 LAB — LIPID PANEL
CHOLESTEROL: 219 mg/dL — AB (ref 0–200)
HDL: 50 mg/dL (ref 40–60)
Ldl Cholesterol, Calc: 142 mg/dL — ABNORMAL HIGH (ref 0–100)
Triglycerides: 135 mg/dL (ref 0–200)
VLDL Cholesterol, Calc: 27 mg/dL (ref 5–40)

## 2014-01-07 LAB — HEMOGLOBIN A1C: Hemoglobin A1C: 5.4 % (ref 4.2–6.3)

## 2014-01-26 ENCOUNTER — Emergency Department: Payer: Self-pay | Admitting: Emergency Medicine

## 2014-01-26 LAB — CBC
HCT: 45.1 % (ref 40.0–52.0)
HGB: 15.5 g/dL (ref 13.0–18.0)
MCH: 29.8 pg (ref 26.0–34.0)
MCHC: 34.4 g/dL (ref 32.0–36.0)
MCV: 87 fL (ref 80–100)
Platelet: 295 10*3/uL (ref 150–440)
RBC: 5.21 10*6/uL (ref 4.40–5.90)
RDW: 13.6 % (ref 11.5–14.5)
WBC: 9.2 10*3/uL (ref 3.8–10.6)

## 2014-01-26 LAB — COMPREHENSIVE METABOLIC PANEL
ALBUMIN: 4.4 g/dL (ref 3.4–5.0)
ALT: 45 U/L (ref 12–78)
Alkaline Phosphatase: 118 U/L — ABNORMAL HIGH
Anion Gap: 7 (ref 7–16)
BUN: 7 mg/dL (ref 7–18)
Bilirubin,Total: 0.3 mg/dL (ref 0.2–1.0)
CALCIUM: 9.3 mg/dL (ref 8.5–10.1)
Chloride: 107 mmol/L (ref 98–107)
Co2: 25 mmol/L (ref 21–32)
Creatinine: 0.85 mg/dL (ref 0.60–1.30)
EGFR (Non-African Amer.): >60
Glucose: 93 mg/dL (ref 65–99)
Osmolality: 275 (ref 275–301)
POTASSIUM: 3.9 mmol/L (ref 3.5–5.1)
SGOT(AST): 22 U/L (ref 15–37)
SODIUM: 139 mmol/L (ref 136–145)
Total Protein: 7.4 g/dL (ref 6.4–8.2)

## 2014-02-16 ENCOUNTER — Emergency Department: Payer: Self-pay | Admitting: Emergency Medicine

## 2014-02-16 LAB — COMPREHENSIVE METABOLIC PANEL
ALBUMIN: 4.7 g/dL (ref 3.4–5.0)
AST: 34 U/L (ref 15–37)
Alkaline Phosphatase: 130 U/L — ABNORMAL HIGH
Anion Gap: 5 — ABNORMAL LOW (ref 7–16)
BILIRUBIN TOTAL: 0.7 mg/dL (ref 0.2–1.0)
BUN: 13 mg/dL (ref 7–18)
CREATININE: 0.96 mg/dL (ref 0.60–1.30)
Calcium, Total: 9.5 mg/dL (ref 8.5–10.1)
Chloride: 104 mmol/L (ref 98–107)
Co2: 25 mmol/L (ref 21–32)
EGFR (Non-African Amer.): >60
Glucose: 99 mg/dL (ref 65–99)
Osmolality: 268 (ref 275–301)
POTASSIUM: 3.8 mmol/L (ref 3.5–5.1)
SGPT (ALT): 81 U/L — ABNORMAL HIGH (ref 12–78)
SODIUM: 134 mmol/L — AB (ref 136–145)
TOTAL PROTEIN: 7.8 g/dL (ref 6.4–8.2)

## 2014-02-16 LAB — CBC
HCT: 49.1 % (ref 40.0–52.0)
HGB: 16.6 g/dL (ref 13.0–18.0)
MCH: 28.9 pg (ref 26.0–34.0)
MCHC: 33.8 g/dL (ref 32.0–36.0)
MCV: 85 fL (ref 80–100)
PLATELETS: 309 10*3/uL (ref 150–440)
RBC: 5.75 10*6/uL (ref 4.40–5.90)
RDW: 13.1 % (ref 11.5–14.5)
WBC: 9.7 10*3/uL (ref 3.8–10.6)

## 2014-02-16 LAB — URINALYSIS, COMPLETE
Bacteria: NONE SEEN
Bilirubin,UR: NEGATIVE
Blood: NEGATIVE
Glucose,UR: NEGATIVE mg/dL (ref 0–75)
Hyaline Cast: 3
KETONE: NEGATIVE
LEUKOCYTE ESTERASE: NEGATIVE
Nitrite: NEGATIVE
Ph: 5 (ref 4.5–8.0)
Protein: NEGATIVE
RBC,UR: <1 /HPF (ref 0–5)
SQUAMOUS EPITHELIAL: NONE SEEN
Specific Gravity: 1.026 (ref 1.003–1.030)

## 2014-02-16 LAB — DRUG SCREEN, URINE
Amphetamines, Ur Screen: NEGATIVE (ref ?–1000)
Barbiturates, Ur Screen: NEGATIVE (ref ?–200)
Benzodiazepine, Ur Scrn: NEGATIVE (ref ?–200)
CANNABINOID 50 NG, UR ~~LOC~~: NEGATIVE (ref ?–50)
COCAINE METABOLITE, UR ~~LOC~~: NEGATIVE (ref ?–300)
MDMA (Ecstasy)Ur Screen: NEGATIVE (ref ?–500)
Methadone, Ur Screen: NEGATIVE (ref ?–300)
Opiate, Ur Screen: NEGATIVE (ref ?–300)
Phencyclidine (PCP) Ur S: NEGATIVE (ref ?–25)
Tricyclic, Ur Screen: NEGATIVE (ref ?–1000)

## 2014-02-16 LAB — ETHANOL

## 2014-02-16 LAB — SALICYLATE LEVEL

## 2014-02-16 LAB — ACETAMINOPHEN LEVEL

## 2014-02-18 ENCOUNTER — Inpatient Hospital Stay: Payer: Self-pay | Admitting: Psychiatry

## 2014-02-18 LAB — DIFFERENTIAL
Basophil #: 0.1 10*3/uL (ref 0.0–0.1)
Basophil %: 1 %
EOS ABS: 0.7 10*3/uL (ref 0.0–0.7)
EOS PCT: 7.4 %
Lymphocyte #: 2.2 10*3/uL (ref 1.0–3.6)
Lymphocyte %: 22.4 %
Monocyte #: 0.9 x10 3/mm (ref 0.2–1.0)
Monocyte %: 9.8 %
NEUTROS ABS: 5.8 10*3/uL (ref 1.4–6.5)
Neutrophil %: 59.4 %

## 2014-03-22 ENCOUNTER — Emergency Department: Payer: Self-pay | Admitting: Emergency Medicine

## 2014-03-22 LAB — COMPREHENSIVE METABOLIC PANEL
AST: 30 U/L (ref 15–37)
Albumin: 4.7 g/dL (ref 3.4–5.0)
Alkaline Phosphatase: 141 U/L — ABNORMAL HIGH
Anion Gap: 9 (ref 7–16)
BILIRUBIN TOTAL: 0.5 mg/dL (ref 0.2–1.0)
BUN: 16 mg/dL (ref 7–18)
Calcium, Total: 9.7 mg/dL (ref 8.5–10.1)
Chloride: 103 mmol/L (ref 98–107)
Co2: 25 mmol/L (ref 21–32)
Creatinine: 1.02 mg/dL (ref 0.60–1.30)
Glucose: 79 mg/dL (ref 65–99)
Osmolality: 274 (ref 275–301)
Potassium: 3.5 mmol/L (ref 3.5–5.1)
SGPT (ALT): 66 U/L (ref 12–78)
Sodium: 137 mmol/L (ref 136–145)
Total Protein: 8.1 g/dL (ref 6.4–8.2)

## 2014-03-22 LAB — URINALYSIS, COMPLETE
BLOOD: NEGATIVE
Bacteria: NONE SEEN
Bilirubin,UR: NEGATIVE
GLUCOSE, UR: NEGATIVE mg/dL (ref 0–75)
Hyaline Cast: 11
KETONE: NEGATIVE
Leukocyte Esterase: NEGATIVE
NITRITE: NEGATIVE
PH: 5 (ref 4.5–8.0)
Protein: 30
Specific Gravity: 1.029 (ref 1.003–1.030)
Squamous Epithelial: NONE SEEN
WBC UR: 1 /HPF (ref 0–5)

## 2014-03-22 LAB — CBC
HCT: 47.8 % (ref 40.0–52.0)
HGB: 15.7 g/dL (ref 13.0–18.0)
MCH: 27.7 pg (ref 26.0–34.0)
MCHC: 32.9 g/dL (ref 32.0–36.0)
MCV: 84 fL (ref 80–100)
PLATELETS: 340 10*3/uL (ref 150–440)
RBC: 5.68 10*6/uL (ref 4.40–5.90)
RDW: 13.3 % (ref 11.5–14.5)
WBC: 12.8 10*3/uL — ABNORMAL HIGH (ref 3.8–10.6)

## 2014-03-22 LAB — DRUG SCREEN, URINE

## 2014-03-23 LAB — ETHANOL: Ethanol %: <0.003 % (ref 0.000–0.080)

## 2014-05-13 ENCOUNTER — Emergency Department: Payer: Self-pay | Admitting: Emergency Medicine

## 2014-05-13 LAB — COMPREHENSIVE METABOLIC PANEL
ALT: 44 U/L (ref 12–78)
ANION GAP: 7 (ref 7–16)
Albumin: 4.3 g/dL (ref 3.4–5.0)
Alkaline Phosphatase: 115 U/L
BUN: 9 mg/dL (ref 7–18)
Bilirubin,Total: 0.3 mg/dL (ref 0.2–1.0)
CALCIUM: 9.3 mg/dL (ref 8.5–10.1)
CREATININE: 1.04 mg/dL (ref 0.60–1.30)
Chloride: 106 mmol/L (ref 98–107)
Co2: 27 mmol/L (ref 21–32)
EGFR (African American): >60
EGFR (Non-African Amer.): >60
GLUCOSE: 88 mg/dL (ref 65–99)
OSMOLALITY: 278 (ref 275–301)
Potassium: 3.4 mmol/L — ABNORMAL LOW (ref 3.5–5.1)
SGOT(AST): 25 U/L (ref 15–37)
Sodium: 140 mmol/L (ref 136–145)
TOTAL PROTEIN: 7 g/dL (ref 6.4–8.2)

## 2014-05-13 LAB — DRUG SCREEN, URINE

## 2014-05-13 LAB — URINALYSIS, COMPLETE
BILIRUBIN, UR: NEGATIVE
Blood: NEGATIVE
Glucose,UR: NEGATIVE mg/dL (ref 0–75)
Ketone: NEGATIVE
Leukocyte Esterase: NEGATIVE
NITRITE: NEGATIVE
PH: 5 (ref 4.5–8.0)
Protein: 30
RBC,UR: 2 /HPF (ref 0–5)
SQUAMOUS EPITHELIAL: NONE SEEN
Specific Gravity: 1.025 (ref 1.003–1.030)
WBC UR: 2 /HPF (ref 0–5)

## 2014-05-13 LAB — SALICYLATE LEVEL: Salicylates, Serum: <1.7 mg/dL

## 2014-05-13 LAB — CBC
HCT: 43 % (ref 40.0–52.0)
HGB: 14.4 g/dL (ref 13.0–18.0)
MCH: 28.4 pg (ref 26.0–34.0)
MCHC: 33.5 g/dL (ref 32.0–36.0)
MCV: 85 fL (ref 80–100)
PLATELETS: 271 10*3/uL (ref 150–440)
RBC: 5.07 10*6/uL (ref 4.40–5.90)
RDW: 13.4 % (ref 11.5–14.5)
WBC: 12 10*3/uL — AB (ref 3.8–10.6)

## 2014-05-13 LAB — ETHANOL
Ethanol %: <0.003 % (ref 0.000–0.080)
Ethanol: <3 mg/dL

## 2014-05-13 LAB — ACETAMINOPHEN LEVEL: Acetaminophen: <2 ug/mL

## 2014-05-14 LAB — CBC WITH DIFFERENTIAL/PLATELET
BASOS PCT: 0.8 %
Basophil #: 0.1 10*3/uL (ref 0.0–0.1)
Eosinophil #: 0.5 10*3/uL (ref 0.0–0.7)
Eosinophil %: 4.5 %
HCT: 44.1 % (ref 40.0–52.0)
HGB: 14.8 g/dL (ref 13.0–18.0)
LYMPHS ABS: 4 10*3/uL — AB (ref 1.0–3.6)
Lymphocyte %: 36 %
MCH: 28.4 pg (ref 26.0–34.0)
MCHC: 33.6 g/dL (ref 32.0–36.0)
MCV: 85 fL (ref 80–100)
Monocyte #: 0.7 x10 3/mm (ref 0.2–1.0)
Monocyte %: 6.7 %
NEUTROS ABS: 5.7 10*3/uL (ref 1.4–6.5)
NEUTROS PCT: 52 %
PLATELETS: 271 10*3/uL (ref 150–440)
RBC: 5.21 10*6/uL (ref 4.40–5.90)
RDW: 13.4 % (ref 11.5–14.5)
WBC: 11 10*3/uL — ABNORMAL HIGH (ref 3.8–10.6)

## 2014-05-21 LAB — DIFFERENTIAL
BASOS ABS: 0.1 10*3/uL (ref 0.0–0.1)
Basophil %: 0.7 %
Eosinophil #: 0.5 10*3/uL (ref 0.0–0.7)
Eosinophil %: 5.4 %
LYMPHS ABS: 3.9 10*3/uL — AB (ref 1.0–3.6)
Lymphocyte %: 39.8 %
Monocyte #: 0.9 x10 3/mm (ref 0.2–1.0)
Monocyte %: 8.9 %
NEUTROS ABS: 4.4 10*3/uL (ref 1.4–6.5)
Neutrophil %: 45.2 %

## 2014-05-21 LAB — WBC: WBC: 9.7 10*3/uL (ref 3.8–10.6)

## 2014-09-20 ENCOUNTER — Inpatient Hospital Stay: Payer: Self-pay | Admitting: Psychiatry

## 2014-09-20 LAB — URINALYSIS, COMPLETE
BACTERIA: NONE SEEN
Bilirubin,UR: NEGATIVE
Blood: NEGATIVE
GLUCOSE, UR: NEGATIVE mg/dL (ref 0–75)
KETONE: NEGATIVE
LEUKOCYTE ESTERASE: NEGATIVE
Nitrite: NEGATIVE
Ph: 5 (ref 4.5–8.0)
Protein: 30
RBC,UR: 1 /HPF (ref 0–5)
Specific Gravity: 1.033 (ref 1.003–1.030)
Squamous Epithelial: NONE SEEN

## 2014-09-20 LAB — COMPREHENSIVE METABOLIC PANEL
ANION GAP: 6 — AB (ref 7–16)
Albumin: 4.5 g/dL (ref 3.4–5.0)
Alkaline Phosphatase: 109 U/L
BUN: 10 mg/dL (ref 7–18)
Bilirubin,Total: 0.3 mg/dL (ref 0.2–1.0)
CO2: 26 mmol/L (ref 21–32)
CREATININE: 0.92 mg/dL (ref 0.60–1.30)
Calcium, Total: 8.7 mg/dL (ref 8.5–10.1)
Chloride: 109 mmol/L — ABNORMAL HIGH (ref 98–107)
EGFR (African American): >60
EGFR (Non-African Amer.): >60
Glucose: 102 mg/dL — ABNORMAL HIGH (ref 65–99)
Osmolality: 280 (ref 275–301)
Potassium: 4 mmol/L (ref 3.5–5.1)
SGOT(AST): 42 U/L — ABNORMAL HIGH (ref 15–37)
SGPT (ALT): 57 U/L
SODIUM: 141 mmol/L (ref 136–145)
Total Protein: 7.2 g/dL (ref 6.4–8.2)

## 2014-09-20 LAB — CBC
HCT: 45.4 % (ref 40.0–52.0)
HGB: 15 g/dL (ref 13.0–18.0)
MCH: 28.3 pg (ref 26.0–34.0)
MCHC: 33 g/dL (ref 32.0–36.0)
MCV: 86 fL (ref 80–100)
Platelet: 263 10*3/uL (ref 150–440)
RBC: 5.29 10*6/uL (ref 4.40–5.90)
RDW: 13.2 % (ref 11.5–14.5)
WBC: 7.3 10*3/uL (ref 3.8–10.6)

## 2014-09-20 LAB — DIFFERENTIAL
BASOS PCT: 1.3 %
Basophil #: 0.1 10*3/uL (ref 0.0–0.1)
Eosinophil #: 0.5 10*3/uL (ref 0.0–0.7)
Eosinophil %: 6.7 %
LYMPHS ABS: 2.4 10*3/uL (ref 1.0–3.6)
LYMPHS PCT: 31.8 %
Monocyte #: 0.6 x10 3/mm (ref 0.2–1.0)
Monocyte %: 8.5 %
Neutrophil #: 3.9 10*3/uL (ref 1.4–6.5)
Neutrophil %: 51.7 %

## 2014-09-20 LAB — ACETAMINOPHEN LEVEL

## 2014-09-20 LAB — DRUG SCREEN, URINE
Amphetamines, Ur Screen: NEGATIVE (ref ?–1000)
Barbiturates, Ur Screen: NEGATIVE (ref ?–200)
Benzodiazepine, Ur Scrn: NEGATIVE (ref ?–200)
Cannabinoid 50 Ng, Ur ~~LOC~~: NEGATIVE (ref ?–50)
Cocaine Metabolite,Ur ~~LOC~~: NEGATIVE (ref ?–300)
MDMA (ECSTASY) UR SCREEN: NEGATIVE (ref ?–500)
METHADONE, UR SCREEN: NEGATIVE (ref ?–300)
OPIATE, UR SCREEN: NEGATIVE (ref ?–300)
Phencyclidine (PCP) Ur S: NEGATIVE (ref ?–25)
TRICYCLIC, UR SCREEN: NEGATIVE (ref ?–1000)

## 2014-09-20 LAB — ETHANOL: Ethanol: <3 mg/dL

## 2014-09-20 LAB — SALICYLATE LEVEL

## 2014-09-25 LAB — HEMOGLOBIN A1C: HEMOGLOBIN A1C: 5.5 % (ref 4.2–6.3)

## 2014-09-25 LAB — LIPID PANEL
Cholesterol: 156 mg/dL (ref 0–200)
HDL: 38 mg/dL — AB (ref 40–60)
LDL CHOLESTEROL, CALC: 42 mg/dL (ref 0–100)
Triglycerides: 379 mg/dL — ABNORMAL HIGH (ref 0–200)
VLDL Cholesterol, Calc: 76 mg/dL — ABNORMAL HIGH (ref 5–40)

## 2014-09-25 LAB — TSH: Thyroid Stimulating Horm: 1.16 u[IU]/mL
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
## 2015-03-16 NOTE — Consult Note (Signed)
Brief Consult Note: Diagnosis: schiophrenia.   Patient was seen by consultant.   Consult note dictated.   Orders entered.   Discussed with Attending MD.   Comments: SChiophrenia. Recent worsening with cutting and suicidal behavior. Admit to inpatient unit. Note and orders done.  Electronic Signatures: Clapacs, Jackquline DenmarkJohn T (MD)  (Signed 13-Feb-15 16:24)  Authored: Brief Consult Note   Last Updated: 13-Feb-15 16:24 by Audery Amellapacs, John T (MD)
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
## 2015-03-16 NOTE — H&P (Signed)
PATIENT NAME:  Christopher Meadows, HEINKEL MR#:  [id number] DATE OF BIRTH:  Jan 14, 1991  DATE OF ADMISSION:  02/18/2014  SEX: Male.  RACE: White.  AGE: 24 years.  INITIAL PSYCHIATRIC EVALUATION: Identifying information: The patient is a 24 year old white male, not employed and not married and has been living at group home called [facility] for more than 2 years. The patient has a long history of schizophrenia and he came to the emergency room stating that he had conflicts with somebody, which happens to be his roommate at the group home because he yells at him all the time and he tries to cut himself.  He wanted help here.  HISTORY OF PRESENT ILLNESS: According to information obtained from the patient: This man, who happens to be his roommate has been yelling at him all the time and he told his group home but nobody paid attention and he got upset and he became suicidal. Admits that he has been compliant with medications and taking his medications as prescribed.  PAST PSYCHIATRIC HISTORY: The patient has several inpatient psychiatric admissions to [facility] and the last inpatient psychiatry was in February 2015. In addition, according to information obtained, the patient has a history of aggression towards the people around him. He was mad at someone and currently probably at room mate who has problems and has been yelling at him.  . Though there is a past history of suicide attempts by cutting and overdose, the patient currently denies the same.  SOCIAL HISTORY: The patient does stay in touch with the family of origin. He reports that he likes the Group Home and then he Christopher Meadows go back to the same place in the ER  but he denies the same. However, patient stated that he wanted to go back when he was seen by the undersigned in the [facility] unit in the emergency room but currently he refuses to go back to that place. I Christopher Meadows call  who also has mental problems. No known H.O Mental illness in teh family. in the family. Alcohol  and drugs: Denies drinking alcohol. Denies street or prescription drug abuse. Does admit to smoking nicotine cigarettes, at the rate of a pack a day for may years.  MEDICAL HISTORY: No known history of high blood pressure, no diabetes mellitus, no known H/O major surgeries., no major surgeries. , No history of  MVA and, never been unconscious. The patient has high blood pressure and is on medication for the same. He is on Ventolin inhaler for shortness of breath.  MEDICATIONS: According to information obtained from the chart, the patient was discharged on the following medications in February 2015: Zoloft 50 mg once a day, Zyrtec 10 mg once a day, aspirin 325 mg once a day, Trileptal 600 mg twice a day, Klonopin 0.5 mg once a day, clozapine 200 mg at bedtime and lisinopril 5 mg at bedtime.   ALLERGIES: ALLERGIC TO GEODON AND LITHIUM BUT THIS COULD BE SIDE EFFECTS.   Patient is being followed with a physician that comes to the group home.  PHYSICAL EXAMINATION: VITALS SIGNS: Temperature is 98.2, pulse is 72 per minute and regular, blood pressure is 126/68 mmHg. Respirations 20 per minute and regular.  HEENT: Normocephalic, atraumatic. Eyes: PERRLA. NECK: Supple. CHEST: Normal expansion, normal breath sounds. HEART: Normal S1, S2, no murmurs, rubs, or gallops. ABDOMEN: Soft, no organomegaly. Bowel sound presents. RECTAL/PELVIC: Deferred.  NEUROLOGICAL: Gait is normal. Romberg is negative. Cranial nerves II through XII are grossly intact and normal. Mental status  exam showed patient is somewhat disheveled in appearance with grooming rather poor. Alert and oriented to place but not exactly to date and needed lots of prompting. He knew that he was in [place] and the capital was [place]. He knew that the President lived in [place]. Affect is appropriate with his mood which is upset and frustrated about his living situation and because of the roommate yelling at him all the time. Admits  sometimes he feels paranoid and starts hearing voices and somewhat these are command hallucinations but currently they are not telling him to do anything, feels somewhat depressed. However, he contracts for safety. Cognition is below average. Denies active suicidal or homicidal idea or plans but reports that he does not want to go back to the same group home as of today. Insight and judgment guarded. Impulse control is poor.  IMPRESSION:  AXIS I: Schizophrenia, chronic paranoia exacerbation.   AXIS II: Deferred. AXIS III: Asthma, high blood pressure, and history of migraine headache. AXIS IV: Severe, a long history of mental illness and decompensates under minimal stress. AXIS V: Global assessment of functioning 25 and patient admitted to [facility] for a closer eval and help as needed so that he can be stabilized and discharged to an appropriate place.   He Christopher Meadows be started back on all of his medications which Christopher Meadows be adjusted so that his symptoms Christopher Meadows be under control during the stay in hospital. He Christopher Meadows be given coping skills in detailing with stresses of life including dealing with neighbors and other patients at the group home.  At the time of discharge, the patient is stabilized and appropriate followup appointment Christopher Meadows be made in the community and hopefully he Christopher Meadows go back to the same group home.   ____________________________ Jannet MantisSurya K. Guss Bundehalla, MD skc:lt D: 02/18/2014 19:21:22 ET T: 02/18/2014 21:32:05 ET JOB#: [id number]  cc: Monika SalkSurya K. Guss Bundehalla, MD, <Dictator> Beau FannySURYA K Artemisia Auvil MD ELECTRONICALLY SIGNED 02/19/2014 9:37
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
## 2015-03-16 NOTE — Discharge Summary (Signed)
PATIENT NAME:  Christopher Meadows, Christopher Meadows  DATE OF ADMISSION:  01/05/2014 DATE OF DISCHARGE:  01/11/2014  HOSPITAL COURSE: See dictated history and physical for details of admission. A 24 year old man was admitted from his group home with self-mutilation, suicidal ideation, worsening psychotic symptoms. In the hospital, he was continued on his outpatient psychiatric medicines with minimal change. He stayed withdrawn for several days but gradually became more outgoing and appropriate. He continued to complain of auditory hallucinations, but towards the end of his hospital stay, he was reporting they were not bothering him as much. He did not actually engage in any dangerous behavior while in the hospital. At the time of discharge, he was agreeable to going back to his group home. He has followup treatment which is in place with [facility].   MENTAL STATUS EXAM AT DISCHARGE: Neatly dressed and fairly well-groomed young man, looks his stated age. Cooperative with the interview. Eye contact good. Psychomotor activity normal. Speech still decreased in total amount and flat but better than when he came in, more expressive. Affect is less irritated and more calm. Mood is stated as being okay. Thoughts are still a little bit disorganized but did not make any bizarre statements. Denied hallucinations currently. Denied any suicidal or homicidal ideation. He was agreeable to going back to the group home. He is alert and oriented x 4. Judgment and insight adequate. Short-term memory intact 3 out of 3 objects. Long-term memory grossly intact. Normal fund of knowledge.   DISCHARGE MEDICATIONS: Fioricet 2 every 4 hours as needed for migraine, lisinopril 5 mg once a day, Trileptal 600 mg twice a day, Zoloft 100 mg per day, cetirizine 10 mg once a day, aspirin 325 mg once a day, clozapine 150 mg at night, BuSpar 10 mg 3 times a day, clonazepam 0.5 mg twice a day, albuterol inhaler 2 puffs 4 times  a day as needed for shortness of breath.   LABORATORY RESULTS: Admission labs included an alcohol level that was negative. ALT elevated 301. AST elevated at 80. Alkaline phosphatase elevated at 119. Glucose elevated at 127. Drug screen negative. CBC: White count elevated at 14.4, otherwise normal. Normal neutrophil count. Urinalysis unremarkable. Acetaminophen and salicylates both undetected. Followup tests of his liver panel showed ALT continued to be elevated but improved at 181, AST at 68, alkaline phosphatase 126. Cholesterol elevated 219. LDL elevated 141. Other lipids normal. Hemoglobin A1c 5.4.   DIAGNOSIS, PRINCIPAL AND PRIMARY:  AXIS I: Schizophrenia, undifferentiated.   SECONDARY DIAGNOSES:  AXIS I: No further.  AXIS II: Borderline personality disorder features.  AXIS III: High blood pressure, chronic obstructive pulmonary disease, elevated liver enzymes most probably related to his medication load.  AXIS IV: Severe, chronic burden of illness and social isolation.  AXIS V: Functioning at time of discharge is 55.    ____________________________ Christopher AmelJohn T. Clapacs, Meadows jtc:gb D: 01/25/2014 00:47:47 ET T: 01/25/2014 03:52:33 ET JOB#: [id number]  cc: Christopher AmelJohn T. Clapacs, Meadows, <Dictator> Christopher Meadows ELECTRONICALLY SIGNED 01/25/2014 11:19

## 2015-03-16 NOTE — Consult Note (Signed)
PATIENT NAME:  Christopher Meadows, Christopher Meadows DATE OF BIRTH:  1991-03-25  DATE OF CONSULTATION:  02/17/2014  CONSULTING PHYSICIAN:  Caesar Mannella K. Magdelene Ruark, MD  AGE: 24. SEX: Male. RACE: White.  SUBJECTIVE: The patient was seen in consultation in [facility]. The patient is a 24 year old white male, dropped out of school and never employed, and is single and has been living at group home called [facility] for the past few months. The patient reports that he is very upset and angry about his living situation and when he was asked the reason why, he reported, "If you lived there, you would know." The patient reported that he is having constant conflicts with a roommate who always yells at him. The patient told the staff members but they did not pay any attention, and this man went on yelling and he got upset and started threatening and because, he became very paranoid.   PAST PSYCHIATRIC HISTORY: History of inpatient hold on psychiatry at [facility] under the care of Dr. Toni Amendlapacs in February and March 2015, when he was stabilized and treated for schizophrenia undifferentiated and was given followup appointment with local mental health (that is [facility]) at that time. The patient reports that he was adopted, so he ran away from the home and almost raised himself and he was around with various people, from which he has PTSD symptoms at times.   ALCOHOL AND DRUGS: Denied. Does smoke nicotine cigarettes and currently has given up smoking nicotine cigarettes. The patient reports that he does become self-mutilating and becomes self-hurting because that is the only coping skill he has when he gets upset and paranoid.   OBJECTIVE: The patient is dressed in hospital clothes, alert and oriented. He is calmer now, and he reports that he feels safer and better here than he was at the group home. Upset and irritable and angry about his living situation. Does admit feeling paranoid and suspicious, and he does not  trust the people around. Denies auditory or visual hallucinations. Does have behavioral problems and he uses self-mutilating behavior as a coping skill, and he tried to pull off his left earlobe when he was brought to Emergency Room [facility]. Currently, he contracts for safety.  IMPRESSION: Schizophrenia, chronic, undifferentiated, in exacerbation.  PLAN: Recommend inpatient hold on psychiatry for further observation and stabilization and appropriate followup.   ____________________________ Jannet MantisSurya K. Guss Bundehalla, MD skc:jcm D: 02/17/2014 14:19:14 ET T: 02/17/2014 18:24:15 ET JOB#: [id number]  cc: Monika SalkSurya K. Guss Bundehalla, MD, <Dictator> Beau FannySURYA K Halea Lieb MD ELECTRONICALLY SIGNED 02/18/2014 19:23

## 2015-03-16 NOTE — Consult Note (Signed)
Psychiatry: Follow-up note for this 24 year old gentleman with personality disorder and schizoaffective disorder.  He has been showing no acute behavior problems on the ward but he has a history of aggressive and chaotic behavior which has left him difficult to place.  Patient today has no new complaints.  His affect is calm and blunted mood is stated as okay.  Denies suicidal or homicidal ideation.  Denies hallucinations does not appear to be acutely psychotic. Tolerating medicine well.  No indication to change treatment.  We are working on trying to find some kind of facility that will except him.  Patient understands the difficulty of this task and has been patient.  Electronic Signatures: Audery Amellapacs, John T (MD)  (Signed on 29-Jun-15 22:10)  Authored  Last Updated: 29-Jun-15 22:10 by Audery Amellapacs, John T (MD)
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
## 2015-10-01 IMAGING — CT CT HEAD WITHOUT CONTRAST
1 series · 16 of 30 positions shown, 20 images · non-contrast
Comparison: None.

CLINICAL DATA: Severe headache.  Hypertension.  Schizophrenia.

EXAM:
CT HEAD WITHOUT CONTRAST
TECHNIQUE: Contiguous axial images were obtained from the base of the skull
through the vertex without intravenous contrast.

[Series 2: head wo · axial · 0.44mm/px · z∈[+559,+694]mm · 16 of 34 slices shown, 20 images]
[im 2/34  brain]
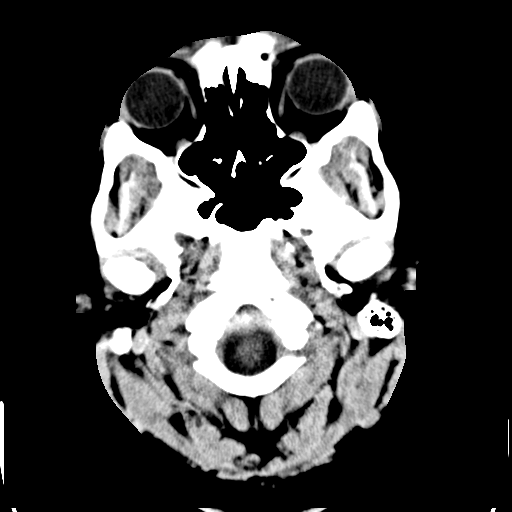
[im 2/34  bone]
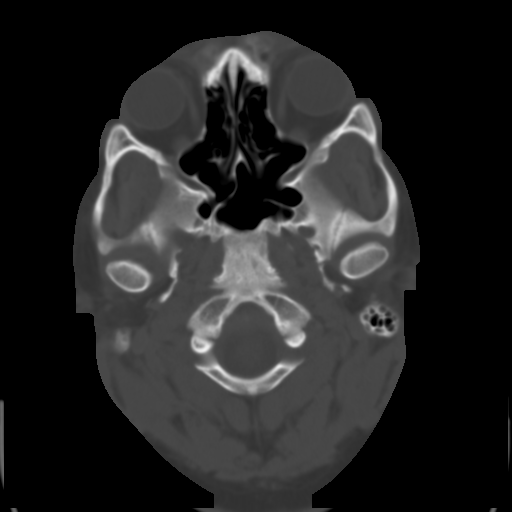
[im 4/34  brain]
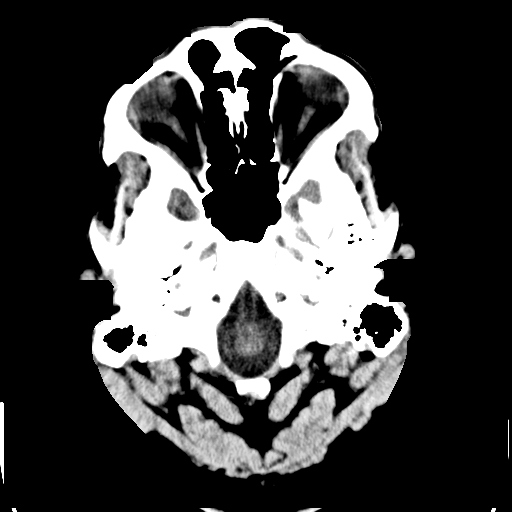
[im 6/34  brain]
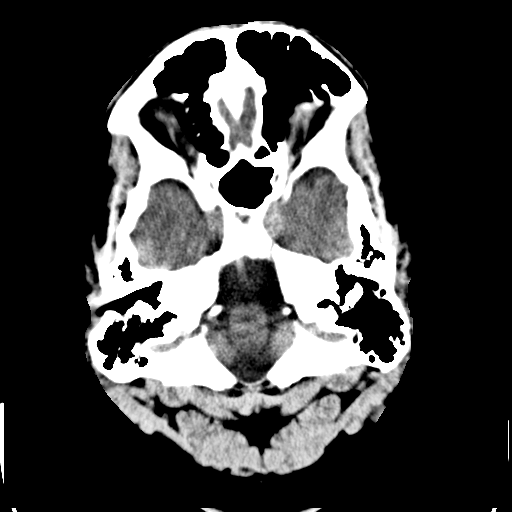
[im 8/34  brain]
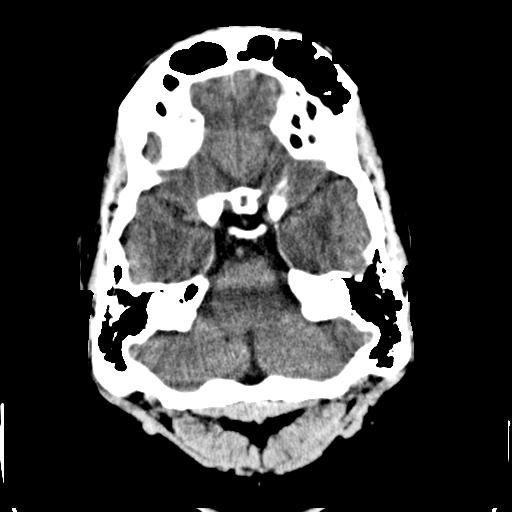
[im 10/34  brain]
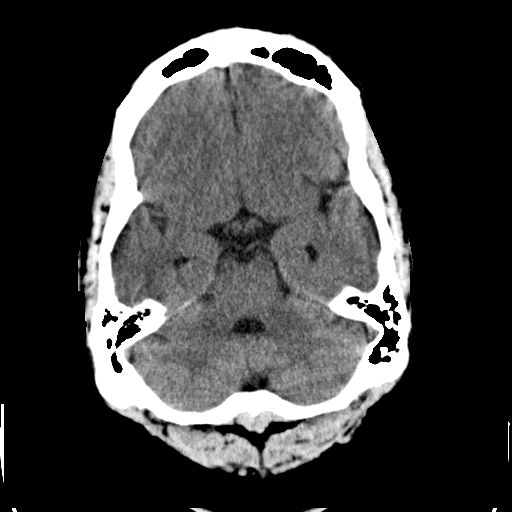
[im 10/34  bone]
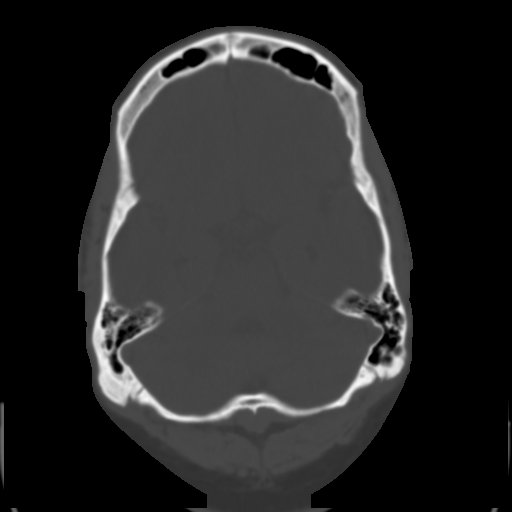
[im 12/34  brain]
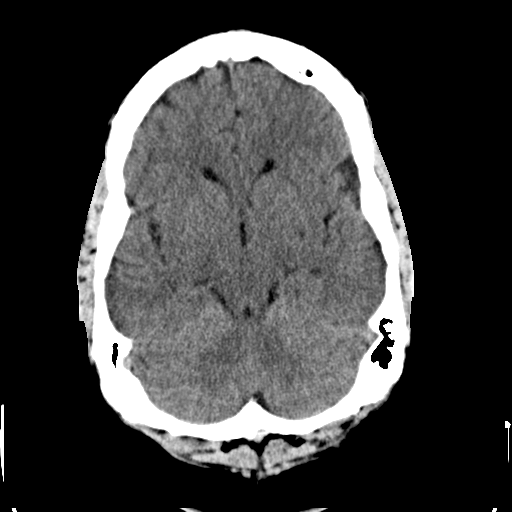
[im 14/34  brain]
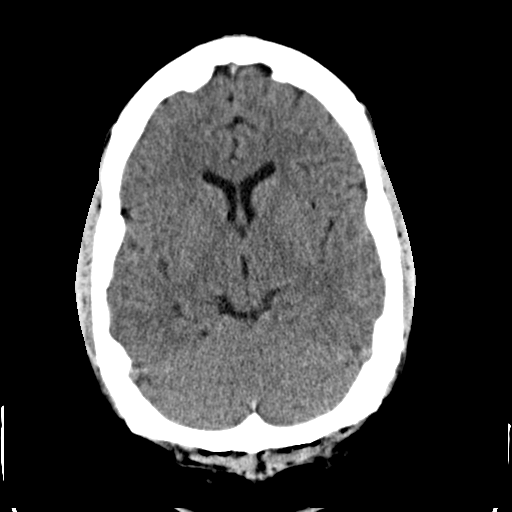
[im 16/34  brain]
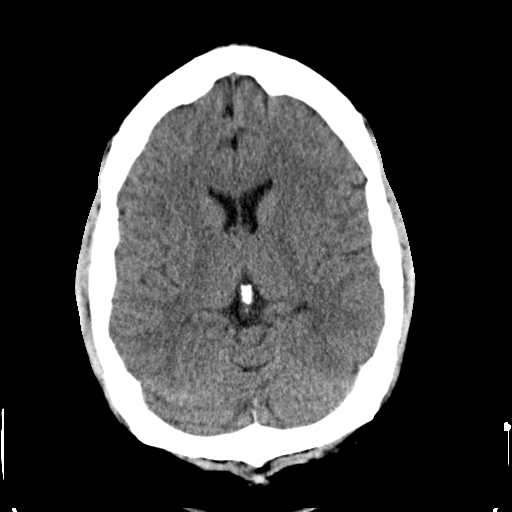
[im 18/34  brain]
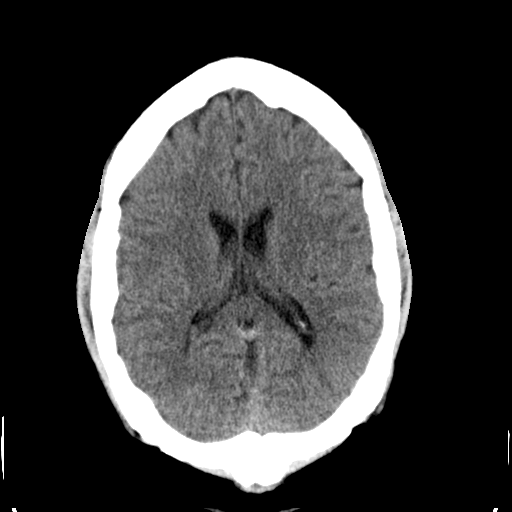
[im 18/34  bone]
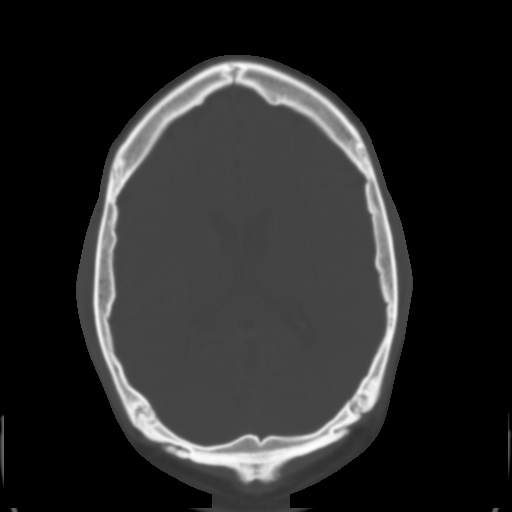
[im 20/34  brain]
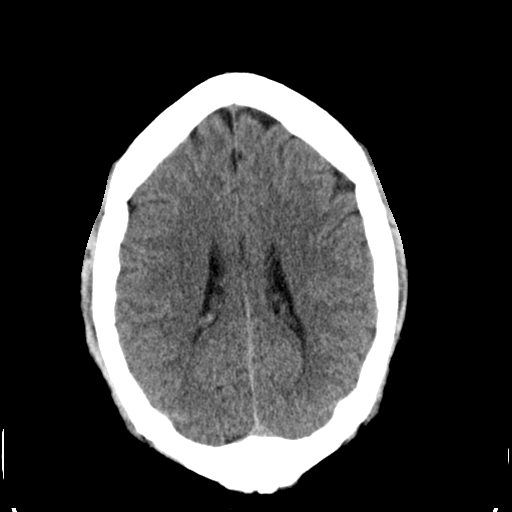
[im 22/34  brain]
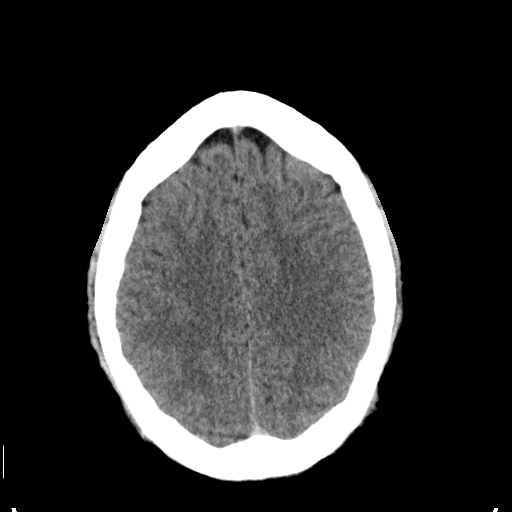
[im 24/34  brain]
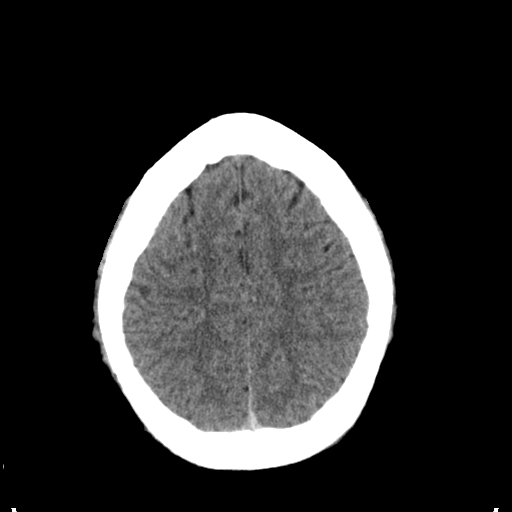
[im 26/34  brain]
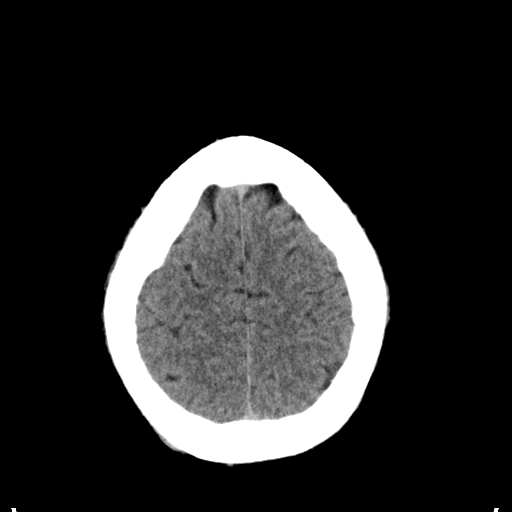
[im 26/34  bone]
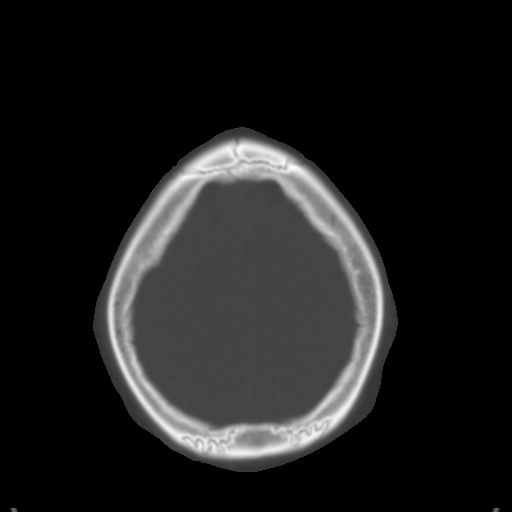
[im 28/34  brain]
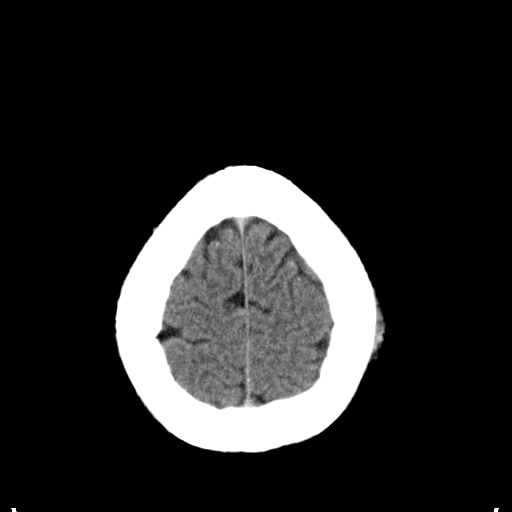
[im 30/34  brain]
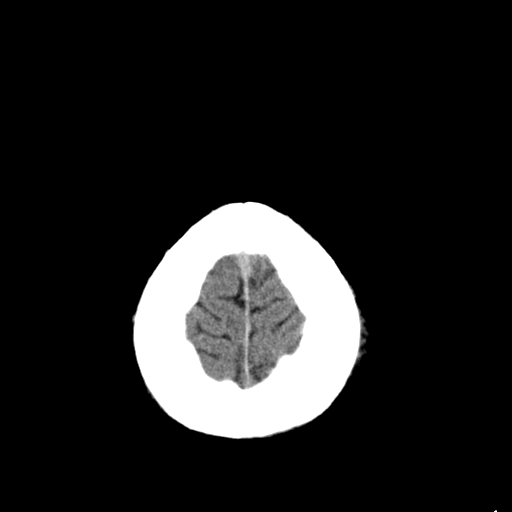
[im 32/34  brain]
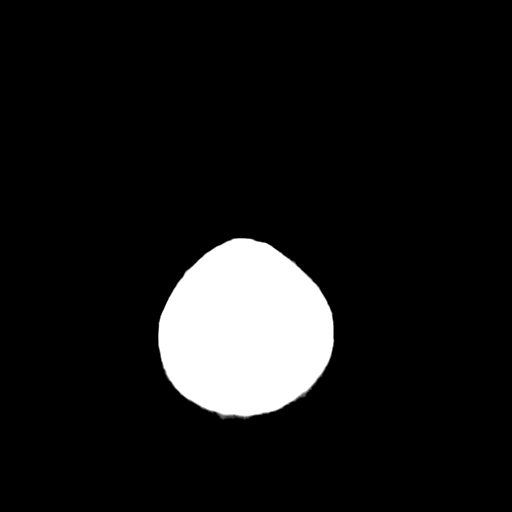

[16 of 30 positions shown; findings below may reference images not displayed]

FINDINGS: No evidence of intracranial hemorrhage, brain edema, or other signs
of acute infarction. No evidence of intracranial mass lesion or mass
effect. No abnormal extraaxial fluid collections identified.
Ventricles are normal in size. No skull abnormality identified.
IMPRESSION: Negative noncontrast head CT.
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
